# Patient Record
Sex: Female | Born: 1965
Health system: Southern US, Community
[De-identification: ages and names within clinical notes are randomized; demographics above are authoritative.]

## PROBLEM LIST (undated history)

## (undated) DIAGNOSIS — M549 Dorsalgia, unspecified: Secondary | ICD-10-CM

## (undated) DIAGNOSIS — Z720 Tobacco use: Secondary | ICD-10-CM

## (undated) HISTORY — PX: TUBAL LIGATION: SHX77

## (undated) HISTORY — DX: Tobacco use: Z72.0

---

## 2010-06-05 ENCOUNTER — Emergency Department (HOSPITAL_COMMUNITY): Admission: EM | Admit: 2010-06-05 | Discharge: 2010-06-05 | Payer: Self-pay | Admitting: Family Medicine

## 2010-07-26 ENCOUNTER — Emergency Department (HOSPITAL_COMMUNITY)
Admission: EM | Admit: 2010-07-26 | Discharge: 2010-07-26 | Payer: Self-pay | Source: Home / Self Care | Admitting: Emergency Medicine

## 2010-09-26 ENCOUNTER — Inpatient Hospital Stay (INDEPENDENT_AMBULATORY_CARE_PROVIDER_SITE_OTHER)
Admission: RE | Admit: 2010-09-26 | Discharge: 2010-09-26 | Disposition: A | Discharge status: Home / Self Care | Payer: Self-pay | Source: Ambulatory Visit | Attending: Family Medicine | Admitting: Family Medicine

## 2010-09-26 DIAGNOSIS — IMO0002 Reserved for concepts with insufficient information to code with codable children: Secondary | ICD-10-CM

## 2010-09-28 ENCOUNTER — Emergency Department (HOSPITAL_COMMUNITY)
Admission: EM | Admit: 2010-09-28 | Discharge: 2010-09-28 | Disposition: A | Discharge status: Home / Self Care | Payer: BC Managed Care – PPO | Attending: Emergency Medicine | Admitting: Emergency Medicine

## 2010-09-28 ENCOUNTER — Inpatient Hospital Stay (INDEPENDENT_AMBULATORY_CARE_PROVIDER_SITE_OTHER)
Admission: RE | Admit: 2010-09-28 | Discharge: 2010-09-28 | Disposition: A | Discharge status: Home / Self Care | Payer: BC Managed Care – PPO | Source: Ambulatory Visit | Attending: Emergency Medicine | Admitting: Emergency Medicine

## 2010-09-28 DIAGNOSIS — IMO0002 Reserved for concepts with insufficient information to code with codable children: Secondary | ICD-10-CM | POA: Insufficient documentation

## 2010-09-28 DIAGNOSIS — M25529 Pain in unspecified elbow: Secondary | ICD-10-CM | POA: Insufficient documentation

## 2010-09-28 DIAGNOSIS — M25439 Effusion, unspecified wrist: Secondary | ICD-10-CM | POA: Insufficient documentation

## 2010-09-28 LAB — POCT I-STAT, CHEM 8
Calcium, Ion: 1.03 mmol/L — ABNORMAL LOW (ref 1.12–1.32)
HCT: 39 % (ref 36.0–46.0)
Hemoglobin: 13.3 g/dL (ref 12.0–15.0)
Sodium: 139 mEq/L (ref 135–145)
TCO2: 21 mmol/L (ref 0–100)

## 2010-09-28 LAB — CBC
MCH: 28.2 pg (ref 26.0–34.0)
Platelets: 281 10*3/uL (ref 150–400)
RBC: 4.36 MIL/uL (ref 3.87–5.11)
WBC: 9.3 10*3/uL (ref 4.0–10.5)

## 2010-09-28 LAB — DIFFERENTIAL
Basophils Absolute: 0.1 10*3/uL (ref 0.0–0.1)
Basophils Relative: 1 % (ref 0–1)
Eosinophils Absolute: 0.3 10*3/uL (ref 0.0–0.7)
Monocytes Relative: 8 % (ref 3–12)
Neutro Abs: 5.4 10*3/uL (ref 1.7–7.7)
Neutrophils Relative %: 58 % (ref 43–77)

## 2010-09-29 LAB — CULTURE, ROUTINE-ABSCESS

## 2010-10-02 ENCOUNTER — Ambulatory Visit (INDEPENDENT_AMBULATORY_CARE_PROVIDER_SITE_OTHER): Payer: BC Managed Care – PPO | Admitting: Physician Assistant

## 2010-10-02 DIAGNOSIS — L0291 Cutaneous abscess, unspecified: Secondary | ICD-10-CM

## 2010-11-04 LAB — CULTURE, ROUTINE-ABSCESS

## 2013-08-09 ENCOUNTER — Emergency Department (HOSPITAL_COMMUNITY): Payer: Self-pay

## 2013-08-09 ENCOUNTER — Emergency Department (HOSPITAL_COMMUNITY)
Admission: EM | Admit: 2013-08-09 | Discharge: 2013-08-09 | Disposition: A | Discharge status: Home / Self Care | Payer: Self-pay | Attending: Emergency Medicine | Admitting: Emergency Medicine

## 2013-08-09 ENCOUNTER — Encounter (HOSPITAL_COMMUNITY): Payer: Self-pay | Admitting: Emergency Medicine

## 2013-08-09 DIAGNOSIS — R0602 Shortness of breath: Secondary | ICD-10-CM | POA: Insufficient documentation

## 2013-08-09 DIAGNOSIS — R079 Chest pain, unspecified: Secondary | ICD-10-CM

## 2013-08-09 DIAGNOSIS — R072 Precordial pain: Secondary | ICD-10-CM | POA: Insufficient documentation

## 2013-08-09 DIAGNOSIS — Z880 Allergy status to penicillin: Secondary | ICD-10-CM | POA: Insufficient documentation

## 2013-08-09 DIAGNOSIS — F17200 Nicotine dependence, unspecified, uncomplicated: Secondary | ICD-10-CM | POA: Insufficient documentation

## 2013-08-09 DIAGNOSIS — Z3202 Encounter for pregnancy test, result negative: Secondary | ICD-10-CM | POA: Insufficient documentation

## 2013-08-09 DIAGNOSIS — M549 Dorsalgia, unspecified: Secondary | ICD-10-CM | POA: Insufficient documentation

## 2013-08-09 LAB — BASIC METABOLIC PANEL
CO2: 24 mEq/L (ref 19–32)
Calcium: 9 mg/dL (ref 8.4–10.5)
Creatinine, Ser: 0.98 mg/dL (ref 0.50–1.10)
Glucose, Bld: 90 mg/dL (ref 70–99)

## 2013-08-09 LAB — CBC
MCH: 26.9 pg (ref 26.0–34.0)
MCHC: 32.7 g/dL (ref 30.0–36.0)
MCV: 82.4 fL (ref 78.0–100.0)
Platelets: 284 10*3/uL (ref 150–400)
RBC: 4.31 MIL/uL (ref 3.87–5.11)

## 2013-08-09 LAB — POCT I-STAT TROPONIN I: Troponin i, poc: 0 ng/mL (ref 0.00–0.08)

## 2013-08-09 MED ORDER — MORPHINE SULFATE 4 MG/ML IJ SOLN
4.0000 mg | Freq: Once | INTRAMUSCULAR | Status: AC
  Administered 2013-08-09: 4 mg via INTRAVENOUS
  Filled 2013-08-09: qty 1

## 2013-08-09 MED ORDER — GI COCKTAIL ~~LOC~~
30.0000 mL | Freq: Once | ORAL | Status: AC
  Administered 2013-08-09: 30 mL via ORAL
  Filled 2013-08-09: qty 30

## 2013-08-09 MED ORDER — TRAMADOL HCL 50 MG PO TABS: 50.0000 mg | ORAL_TABLET | Freq: Four times a day (QID) | ORAL | Status: DC | PRN

## 2013-08-09 MED ORDER — CYCLOBENZAPRINE HCL 10 MG PO TABS: 10.0000 mg | ORAL_TABLET | Freq: Two times a day (BID) | ORAL | Status: DC | PRN

## 2013-08-09 MED ORDER — ASPIRIN 81 MG PO CHEW
324.0000 mg | CHEWABLE_TABLET | Freq: Once | ORAL | Status: AC
  Administered 2013-08-09: 324 mg via ORAL
  Filled 2013-08-09: qty 4

## 2013-08-09 MED ORDER — ONDANSETRON HCL 4 MG/2ML IJ SOLN
4.0000 mg | Freq: Once | INTRAMUSCULAR | Status: AC
  Administered 2013-08-09: 4 mg via INTRAVENOUS
  Filled 2013-08-09: qty 2

## 2013-08-09 NOTE — ED Provider Notes (Signed)
Medical screening examination/treatment/procedure(s) were conducted as a shared visit with non-physician practitioner(s) and myself.  I personally evaluated the patient during the encounter.   Please see my separate note.      [name], MD 08/09/13 [phone]

## 2013-08-09 NOTE — ED Provider Notes (Signed)
Medical screening examination/treatment/procedure(s) were conducted as a shared visit with non-physician practitioner(s) and myself.  I personally evaluated the patient during the encounter.  EKG Interpretation    Date/Time:  Wednesday August 09 2013 11:27:48 EST Ventricular Rate:  84 PR Interval:  142 QRS Duration: 68 QT Interval:  338 QTC Calculation: 399 R Axis:   77 Text Interpretation:  Normal sinus rhythm with sinus arrhythmia Possible Anterior infarct , age undetermined Abnormal ECG No old tracing to compare Confirmed by [facility]  MD, TREY ([id number]) on 08/09/2013 2:48:12 PM            47 yo female with constant chest pain for several days.  She smokes, but has no other cardiac risk factors.  History atypical for ACS, PE, or dissection.  Possibly GI related.  On exam, well appearing, sitting up in bed, heart sounds normal with RRR, no murmurs, lungs CTAB.  EKG without acute ischemia.  Lab workup unremarkable.  Plan continued outpatient management.   Clinical Impression: 1. Chest pain       [name], MD 08/09/13 1450

## 2013-08-09 NOTE — ED Notes (Signed)
Pt c/o mid sternal CP x 4 days worse with movement and inspiration

## 2013-08-09 NOTE — ED Notes (Signed)
Pt ambulates without distress. Family to drive pt home.

## 2013-08-09 NOTE — ED Provider Notes (Signed)
CSN: [id number]     Arrival date & time 08/09/13  1125 History   First MD Initiated Contact with Patient 08/09/13 1137     Chief Complaint  Patient presents with  . Chest Pain   (Consider location/radiation/quality/duration/timing/severity/associated sxs/prior Treatment) HPI Comments: Patient is a 47 year old female with no significant past medical history who presents with chest pain that started 4 days ago. Patient reports waking up one morning and having severe central back pain. The pain eventually moved to her anterior central chest throughout the day and has remained there. The pain is described as a pressure and is severe. The pain does not radiate. She reports associated SOB. The pain has been constant since the onset. Patient has no significant family history of CAD. Patient is a smoker. She denies recent travel/surgery, exogenous estrogen, or previous DVT/PE.    History reviewed. No pertinent past medical history. History reviewed. No pertinent past surgical history. History reviewed. No pertinent family history. History  Substance Use Topics  . Smoking status: Current Every Day Smoker  . Smokeless tobacco: Not on file  . Alcohol Use: No   OB History   Grav Para Term Preterm Abortions TAB SAB Ect Mult Living                 Review of Systems  Constitutional: Negative for fever, chills and fatigue.  HENT: Negative for trouble swallowing.   Eyes: Negative for visual disturbance.  Respiratory: Positive for shortness of breath.   Cardiovascular: Positive for chest pain. Negative for palpitations.  Gastrointestinal: Negative for nausea, vomiting, abdominal pain and diarrhea.  Genitourinary: Negative for dysuria and difficulty urinating.  Musculoskeletal: Positive for back pain. Negative for arthralgias and neck pain.  Skin: Negative for color change.  Neurological: Negative for dizziness and weakness.  Psychiatric/Behavioral: Negative for dysphoric mood.    Allergies   Penicillins and Sulfa antibiotics  Home Medications   Current Outpatient Rx  Name  Route  Sig  Dispense  Refill  . simethicone (MYLICON) 80 MG chewable tablet   Oral   Chew 80 mg by mouth every 6 (six) hours as needed for flatulence.          BP 137/83  Pulse 72  Temp(Src) 98.6 F (37 C) (Oral)  Resp 18  Ht 5\' 7"  (1.702 m)  Wt 207 lb (93.895 kg)  BMI 32.41 kg/m2  SpO2 100% Physical Exam  Nursing note and vitals reviewed. Constitutional: She is oriented to person, place, and time. She appears well-developed and well-nourished. No distress.  HENT:  Head: Normocephalic and atraumatic.  Eyes: Conjunctivae and EOM are normal.  Neck: Normal range of motion.  Cardiovascular: Normal rate and regular rhythm.  Exam reveals no gallop and no friction rub.   No murmur heard. Pulmonary/Chest: Effort normal and breath sounds normal. She has no wheezes. She has no rales. She exhibits no tenderness.  Abdominal: Soft. She exhibits no distension. There is no tenderness. There is no rebound.  Musculoskeletal: Normal range of motion.  No calf tenderness to palpation bilaterally. No paraspinal or midline spine tenderness to palpation.   Neurological: She is alert and oriented to person, place, and time. Coordination normal.  Speech is goal-oriented. Moves limbs without ataxia.   Skin: Skin is warm and dry.  Psychiatric: She has a normal mood and affect. Her behavior is normal.    ED Course  Procedures (including critical care time) Labs Review Labs Reviewed  CBC - Abnormal; Notable for the following:  Hemoglobin 11.6 (*)    HCT 35.5 (*)    RDW 16.1 (*)    All other components within normal limits  BASIC METABOLIC PANEL - Abnormal; Notable for the following:    GFR calc non Af Amer 69 (*)    GFR calc Af Amer 80 (*)    All other components within normal limits  D-DIMER, QUANTITATIVE  POCT I-STAT TROPONIN I   Imaging Review Dg Chest 2 View  08/09/2013   CLINICAL DATA:  Chest  pain  EXAM: CHEST  2 VIEW  COMPARISON:  None.  FINDINGS: Normal cardiac silhouette and mediastinal contours. Evaluation of the retrosternal spare place is obscured secondary to overlying soft tissues. No focal airspace opacities. No pleural effusion or pneumothorax. No evidence of edema. No acute osseus abnormalities. Hair artifact overlies the right supraclavicular fossa.  IMPRESSION: No acute cardiopulmonary disease.   Electronically Signed   By: Simonne Come M.D.   On: 08/09/2013 12:53    EKG Interpretation    Date/Time:  Wednesday August 09 2013 11:27:48 EST Ventricular Rate:  84 PR Interval:  142 QRS Duration: 68 QT Interval:  338 QTC Calculation: 399 R Axis:   77 Text Interpretation:  Normal sinus rhythm with sinus arrhythmia Possible Anterior infarct , age undetermined Abnormal ECG No old tracing to compare Confirmed by [facility]  MD, TREY ([id number]) on 08/09/2013 2:48:12 PM            MDM   1. Chest pain     12:02 PM Labs, troponin, chest xray pending. Vitals stable and patient afebrile. Patient will have aspirin and morphine.   1:40 PM Labs and chest xray unremarkable for acute changes. Patient will have a d-dimer. Patient reports relief with morphine.   2:50 PM D-dimer negative. Patient is feeling better. Patient is low risk with a HEART score of 1. Vitals stable and patient afebrile. I doubt cardiac etiology at this time. Patient is requesting a GI cocktail before discharge. Patient will be discharge with instructions to follow up with PCP from the resource guide. Patient instructed to return with worsening or concerning symptoms. Patient likely experiencing GERD or muscle pain at this time.     Emilia Beck, PA-C 08/09/13 1452

## 2013-09-14 ENCOUNTER — Ambulatory Visit: Payer: BC Managed Care – PPO | Admitting: Internal Medicine

## 2013-10-17 ENCOUNTER — Ambulatory Visit: Payer: BC Managed Care – PPO

## 2013-10-25 ENCOUNTER — Ambulatory Visit: Payer: BC Managed Care – PPO

## 2014-03-07 ENCOUNTER — Encounter (HOSPITAL_COMMUNITY): Payer: Self-pay | Admitting: Emergency Medicine

## 2014-03-07 ENCOUNTER — Emergency Department (HOSPITAL_COMMUNITY)
Admission: EM | Admit: 2014-03-07 | Discharge: 2014-03-07 | Disposition: A | Discharge status: Home / Self Care | Payer: 59 | Attending: Emergency Medicine | Admitting: Emergency Medicine

## 2014-03-07 ENCOUNTER — Emergency Department (HOSPITAL_COMMUNITY): Payer: 59

## 2014-03-07 DIAGNOSIS — M545 Low back pain, unspecified: Secondary | ICD-10-CM | POA: Insufficient documentation

## 2014-03-07 DIAGNOSIS — Z880 Allergy status to penicillin: Secondary | ICD-10-CM | POA: Insufficient documentation

## 2014-03-07 DIAGNOSIS — F17200 Nicotine dependence, unspecified, uncomplicated: Secondary | ICD-10-CM | POA: Insufficient documentation

## 2014-03-07 DIAGNOSIS — Z3202 Encounter for pregnancy test, result negative: Secondary | ICD-10-CM | POA: Insufficient documentation

## 2014-03-07 LAB — POC URINE PREG, ED: Preg Test, Ur: NEGATIVE

## 2014-03-07 LAB — URINALYSIS, ROUTINE W REFLEX MICROSCOPIC
BILIRUBIN URINE: NEGATIVE
Glucose, UA: NEGATIVE mg/dL
HGB URINE DIPSTICK: NEGATIVE
KETONES UR: NEGATIVE mg/dL
NITRITE: NEGATIVE
PROTEIN: NEGATIVE mg/dL
SPECIFIC GRAVITY, URINE: 1.021 (ref 1.005–1.030)
UROBILINOGEN UA: 0.2 mg/dL (ref 0.0–1.0)
pH: 5.5 (ref 5.0–8.0)

## 2014-03-07 LAB — URINE MICROSCOPIC-ADD ON

## 2014-03-07 MED ORDER — OXYCODONE-ACETAMINOPHEN 5-325 MG PO TABS
1.0000 | ORAL_TABLET | Freq: Once | ORAL | Status: AC
  Administered 2014-03-07: 1 via ORAL
  Filled 2014-03-07: qty 1

## 2014-03-07 MED ORDER — TRAMADOL HCL 50 MG PO TABS: 50.0000 mg | ORAL_TABLET | Freq: Four times a day (QID) | ORAL | Status: DC | PRN

## 2014-03-07 MED ORDER — TRAMADOL HCL 50 MG PO TABS
50.0000 mg | ORAL_TABLET | Freq: Once | ORAL | Status: AC
  Administered 2014-03-07: 50 mg via ORAL
  Filled 2014-03-07: qty 1

## 2014-03-07 MED ORDER — IBUPROFEN 600 MG PO TABS: 600.0000 mg | ORAL_TABLET | Freq: Three times a day (TID) | ORAL | Status: DC | PRN

## 2014-03-07 MED ORDER — IBUPROFEN 200 MG PO TABS
600.0000 mg | ORAL_TABLET | Freq: Once | ORAL | Status: AC
  Administered 2014-03-07: 600 mg via ORAL
  Filled 2014-03-07: qty 3

## 2014-03-07 NOTE — ED Provider Notes (Addendum)
CSN: [id number]     Arrival date & time 03/07/14  [id number] History   First MD Initiated Contact with Patient 03/07/14 [id number]     Chief Complaint  Patient presents with  . Back Pain     (Consider location/radiation/quality/duration/timing/severity/associated sxs/prior Treatment) Patient is a 48 y.o. female presenting with back pain. The history is provided by the patient.  Back Pain Associated symptoms: no abdominal pain, no chest pain, no dysuria, no fever, no headaches, no numbness and no weakness   pt c/o low back pain, esp right, radiating down right leg.  Onset yesterday. Constant. Moderate. No perineal or leg numbness/weakness. No urinary retention or incontinence. No hx ddd or prior back pain. Denies specific injury or strain, no fall. Worse w certain movements, bending at waist, turning torso.  No anterior/abd pain. No dysuria or hematuria. No vaginal discharge or bleeding.     History reviewed. No pertinent past medical history. Past Surgical History  Procedure Laterality Date  . Tubal ligation     No family history on file. History  Substance Use Topics  . Smoking status: Current Every Day Smoker  . Smokeless tobacco: Not on file  . Alcohol Use: No   OB History   Grav Para Term Preterm Abortions TAB SAB Ect Mult Living                 Review of Systems  Constitutional: Negative for fever and chills.  HENT: Negative for sore throat.   Eyes: Negative for redness.  Respiratory: Negative for shortness of breath.   Cardiovascular: Negative for chest pain.  Gastrointestinal: Negative for vomiting and abdominal pain.  Genitourinary: Negative for dysuria, hematuria, flank pain, vaginal bleeding and vaginal discharge.  Musculoskeletal: Positive for back pain. Negative for neck pain.  Skin: Negative for rash.  Neurological: Negative for weakness, numbness and headaches.  Hematological: Does not bruise/bleed easily.  Psychiatric/Behavioral: Negative for confusion.       Allergies  Penicillins and Sulfa antibiotics  Home Medications   Prior to Admission medications   Medication Sig Start Date End Date Taking? Authorizing Provider  cyclobenzaprine (FLEXERIL) 10 MG tablet Take 1 tablet (10 mg total) by mouth 2 (two) times daily as needed for muscle spasms. 08/09/13  Yes Courtney ParisEden W Jones, MD   BP 127/101  Pulse 95  Temp(Src) 97.8 F (36.6 C) (Oral)  Resp 18  Ht 5' 7.5" (1.715 m)  Wt 207 lb (93.895 kg)  BMI 31.92 kg/m2  SpO2 98%  LMP 01/26/2014 Physical Exam  Nursing note and vitals reviewed. Constitutional: She is oriented to person, place, and time. She appears well-developed and well-nourished. No distress.  Eyes: Conjunctivae are normal. No scleral icterus.  Neck: Neck supple. No tracheal deviation present.  Cardiovascular: Normal rate.   Pulmonary/Chest: Effort normal. No respiratory distress.  Abdominal: Soft. Normal appearance and bowel sounds are normal. She exhibits no distension and no mass. There is no tenderness. There is no rebound and no guarding.  Genitourinary:  No cva tenderness  Musculoskeletal: She exhibits no edema.  Mid to lower lumbar tenderness, otherwise CTLS spine, non tender, aligned, no step off.   Neurological: She is alert and oriented to person, place, and time.  Motor intact bil LE, stre 5/5, sens intact. Symmetric reflexes. Steady gait.   Skin: Skin is warm and dry. No rash noted.  Psychiatric: She has a normal mood and affect.    ED Course  Procedures (including critical care time) Labs Review  Results for orders placed  during the hospital encounter of 03/07/14  URINALYSIS, ROUTINE W REFLEX MICROSCOPIC      Result Value Ref Range   Color, Urine YELLOW  YELLOW   APPearance CLOUDY (*) CLEAR   Specific Gravity, Urine 1.021  1.005 - 1.030   pH 5.5  5.0 - 8.0   Glucose, UA NEGATIVE  NEGATIVE mg/dL   Hgb urine dipstick NEGATIVE  NEGATIVE   Bilirubin Urine NEGATIVE  NEGATIVE   Ketones, ur NEGATIVE   NEGATIVE mg/dL   Protein, ur NEGATIVE  NEGATIVE mg/dL   Urobilinogen, UA 0.2  0.0 - 1.0 mg/dL   Nitrite NEGATIVE  NEGATIVE   Leukocytes, UA SMALL (*) NEGATIVE  URINE MICROSCOPIC-ADD ON      Result Value Ref Range   Squamous Epithelial / LPF MANY (*) RARE   WBC, UA 3-6  <3 WBC/hpf   Bacteria, UA FEW (*) RARE   Urine-Other RARE TRICHOMONAS    POC URINE PREG, ED      Result Value Ref Range   Preg Test, Ur NEGATIVE  NEGATIVE   Dg Lumbar Spine Complete  03/07/2014   CLINICAL DATA:  Low back pain  EXAM: LUMBAR SPINE - COMPLETE 4+ VIEW  COMPARISON:  None.  FINDINGS: The vertebral bodies are preserved in height. The disc space heights and facet joints are normal. There is no spondylolisthesis. The pedicles and transverse processes are normal. The observed portions of the sacrum are unremarkable.  IMPRESSION: There is no acute abnormality of the lumbar spine.   Electronically Signed   By: David  [place]   On: 03/07/2014 07:47     MDM  Motrin po. Xr.  Reviewed nursing notes and prior charts for additional history.   xrays neg acute. u preg neg.  Pt drove self to ed.  Will give pain rx for home.  Pt stable for d/c.  At d/c requests stronger pain med in ed, states she can call family to come pick her up.  Ultram po.     Suzi Roots, MD 03/07/14 [phone]

## 2014-03-07 NOTE — ED Notes (Signed)
Pt from home and reports that about 6:30pm yesterday pt began having sharp, constant right lower back 10/10 pain, worse with movement. Denies abnormal urinary symptoms and denies recent injury or trauma to area.

## 2014-03-07 NOTE — ED Notes (Signed)
Patient transported to X-ray 

## 2014-03-07 NOTE — ED Notes (Addendum)
Doctor notified pain currently 10/10 achy sharp back pain. Patient stated drove to ED and will have family member pick patient up from hospital.

## 2014-03-07 NOTE — ED Notes (Signed)
Patient returned from xray.

## 2014-03-07 NOTE — ED Notes (Signed)
Patient called and confirmed a ride.

## 2014-03-07 NOTE — Discharge Instructions (Signed)
Take motrin as need for pain. You may also take ultram as need for pain - no driving when taking.  Follow up with primary care doctor in 1-2 weeks. Return to ER if worse, new symptoms, fevers, intractable pain, leg numbness/weakness, other concern.   You were given pain medication in the ER - no driving for the next 4 hours.     Back Pain, Adult Low back pain is very common. About 1 in 5 people have back pain.The cause of low back pain is rarely dangerous. The pain often gets better over time.About half of people with a sudden onset of back pain feel better in just 2 weeks. About 8 in 10 people feel better by 6 weeks.  CAUSES Some common causes of back pain include:  Strain of the muscles or ligaments supporting the spine.  Wear and tear (degeneration) of the spinal discs.  Arthritis.  Direct injury to the back. DIAGNOSIS Most of the time, the direct cause of low back pain is not known.However, back pain can be treated effectively even when the exact cause of the pain is unknown.Answering your caregiver's questions about your overall health and symptoms is one of the most accurate ways to make sure the cause of your pain is not dangerous. If your caregiver needs more information, he or she may order lab work or imaging tests (X-rays or MRIs).However, even if imaging tests show changes in your back, this usually does not require surgery. HOME CARE INSTRUCTIONS For many people, back pain returns.Since low back pain is rarely dangerous, it is often a condition that people can learn to [facility] their own.   Remain active. It is stressful on the back to sit or stand in one place. Do not sit, drive, or stand in one place for more than 30 minutes at a time. Take short walks on level surfaces as soon as pain allows.Try to increase the length of time you walk each day.  Do not stay in bed.Resting more than 1 or 2 days can delay your recovery.  Do not avoid exercise or work.Your body  is made to move.It is not dangerous to be active, even though your back may hurt.Your back will likely heal faster if you return to being active before your pain is gone.  Pay attention to your body when you bend and lift. Many people have less discomfortwhen lifting if they bend their knees, keep the load close to their bodies,and avoid twisting. Often, the most comfortable positions are those that put less stress on your recovering back.  Find a comfortable position to sleep. Use a firm mattress and lie on your side with your knees slightly bent. If you lie on your back, put a pillow under your knees.  Only take over-the-counter or prescription medicines as directed by your caregiver. Over-the-counter medicines to reduce pain and inflammation are often the most helpful.Your caregiver may prescribe muscle relaxant drugs.These medicines help dull your pain so you can more quickly return to your normal activities and healthy exercise.  Put ice on the injured area.  Put ice in a plastic bag.  Place a towel between your skin and the bag.  Leave the ice on for 15-20 minutes, 03-04 times a day for the first 2 to 3 days. After that, ice and heat may be alternated to reduce pain and spasms.  Ask your caregiver about trying back exercises and gentle massage. This may be of some benefit.  Avoid feeling anxious or stressed.Stress increases muscle  tension and can worsen back pain.It is important to recognize when you are anxious or stressed and learn ways to manage it.Exercise is a great option. SEEK MEDICAL CARE IF:  You have pain that is not relieved with rest or medicine.  You have pain that does not improve in 1 week.  You have new symptoms.  You are generally not feeling well. SEEK IMMEDIATE MEDICAL CARE IF:   You have pain that radiates from your back into your legs.  You develop new bowel or bladder control problems.  You have unusual weakness or numbness in your arms or  legs.  You develop nausea or vomiting.  You develop abdominal pain.  You feel faint. Document Released: 08/10/2005 Document Revised: 02/09/2012 Document Reviewed: 12/29/2010 [facility] Patient Information 2015 [place], [place]. This information is not intended to replace advice given to you by your health care provider. Make sure you discuss any questions you have with your health care provider.    Lumbosacral Strain Lumbosacral strain is a strain of any of the parts that make up your lumbosacral vertebrae. Your lumbosacral vertebrae are the bones that make up the lower third of your backbone. Your lumbosacral vertebrae are held together by muscles and tough, fibrous tissue (ligaments).  CAUSES  A sudden blow to your back can cause lumbosacral strain. Also, anything that causes an excessive stretch of the muscles in the low back can cause this strain. This is typically seen when people exert themselves strenuously, fall, lift heavy objects, bend, or crouch repeatedly. RISK FACTORS  Physically demanding work.  Participation in pushing or pulling sports or sports that require a sudden twist of the back (tennis, golf, baseball).  Weight lifting.  Excessive lower back curvature.  Forward-tilted pelvis.  Weak back or abdominal muscles or both.  Tight hamstrings. SIGNS AND SYMPTOMS  Lumbosacral strain may cause pain in the area of your injury or pain that moves (radiates) down your leg.  DIAGNOSIS Your health care provider can often diagnose lumbosacral strain through a physical exam. In some cases, you may need tests such as X-ray exams.  TREATMENT  Treatment for your lower back injury depends on many factors that your clinician will have to evaluate. However, most treatment will include the use of anti-inflammatory medicines. HOME CARE INSTRUCTIONS   Avoid hard physical activities (tennis, racquetball, waterskiing) if you are not in proper physical condition for it. This may aggravate  or create problems.  If you have a back problem, avoid sports requiring sudden body movements. Swimming and walking are generally safer activities.  Maintain good posture.  Maintain a healthy weight.  For acute conditions, you may put ice on the injured area.  Put ice in a plastic bag.  Place a towel between your skin and the bag.  Leave the ice on for 20 minutes, 2-3 times a day.  When the low back starts healing, stretching and strengthening exercises may be recommended. SEEK MEDICAL CARE IF:  Your back pain is getting worse.  You experience severe back pain not relieved with medicines. SEEK IMMEDIATE MEDICAL CARE IF:   You have numbness, tingling, weakness, or problems with the use of your arms or legs.  There is a change in bowel or bladder control.  You have increasing pain in any area of the body, including your belly (abdomen).  You notice shortness of breath, dizziness, or feel faint.  You feel sick to your stomach (nauseous), are throwing up (vomiting), or become sweaty.  You notice discoloration of your  toes or legs, or your feet get very cold. MAKE SURE YOU:   Understand these instructions.  Will watch your condition.  Will get help right away if you are not doing well or get worse. Document Released: 05/20/2005 Document Revised: 08/15/2013 Document Reviewed: 03/29/2013 [facility] Patient Information 2015 [place], [place]. This information is not intended to replace advice given to you by your health care provider. Make sure you discuss any questions you have with your health care provider.

## 2014-03-07 NOTE — ED Notes (Signed)
Dr. Steinl at bedside 

## 2014-03-10 ENCOUNTER — Emergency Department (HOSPITAL_COMMUNITY)
Admission: EM | Admit: 2014-03-10 | Discharge: 2014-03-10 | Disposition: A | Discharge status: Home / Self Care | Payer: 59 | Attending: Emergency Medicine | Admitting: Emergency Medicine

## 2014-03-10 ENCOUNTER — Encounter (HOSPITAL_COMMUNITY): Payer: Self-pay | Admitting: Emergency Medicine

## 2014-03-10 DIAGNOSIS — Z79899 Other long term (current) drug therapy: Secondary | ICD-10-CM | POA: Insufficient documentation

## 2014-03-10 DIAGNOSIS — M545 Low back pain, unspecified: Secondary | ICD-10-CM | POA: Insufficient documentation

## 2014-03-10 DIAGNOSIS — Z880 Allergy status to penicillin: Secondary | ICD-10-CM | POA: Insufficient documentation

## 2014-03-10 DIAGNOSIS — F17200 Nicotine dependence, unspecified, uncomplicated: Secondary | ICD-10-CM | POA: Insufficient documentation

## 2014-03-10 DIAGNOSIS — M79609 Pain in unspecified limb: Secondary | ICD-10-CM | POA: Insufficient documentation

## 2014-03-10 HISTORY — DX: Dorsalgia, unspecified: M54.9

## 2014-03-10 MED ORDER — HYDROMORPHONE HCL PF 1 MG/ML IJ SOLN
1.0000 mg | Freq: Once | INTRAMUSCULAR | Status: AC
  Administered 2014-03-10: 1 mg via INTRAMUSCULAR
  Filled 2014-03-10: qty 1

## 2014-03-10 MED ORDER — KETOROLAC TROMETHAMINE 60 MG/2ML IM SOLN
60.0000 mg | Freq: Once | INTRAMUSCULAR | Status: AC
  Administered 2014-03-10: 60 mg via INTRAMUSCULAR
  Filled 2014-03-10: qty 2

## 2014-03-10 MED ORDER — PREDNISONE 10 MG PO TABS: 20.0000 mg | ORAL_TABLET | Freq: Every day | ORAL | Status: DC

## 2014-03-10 MED ORDER — OXYCODONE-ACETAMINOPHEN 7.5-325 MG PO TABS: 1.0000 | ORAL_TABLET | ORAL | Status: DC | PRN

## 2014-03-10 MED ORDER — METHOCARBAMOL 750 MG PO TABS: 750.0000 mg | ORAL_TABLET | Freq: Four times a day (QID) | ORAL | Status: DC

## 2014-03-10 MED ORDER — OXYCODONE-ACETAMINOPHEN 5-325 MG PO TABS
1.0000 | ORAL_TABLET | Freq: Once | ORAL | Status: AC
  Administered 2014-03-10: 1 via ORAL
  Filled 2014-03-10: qty 1

## 2014-03-10 MED ORDER — DIAZEPAM 5 MG PO TABS
10.0000 mg | ORAL_TABLET | Freq: Once | ORAL | Status: AC
  Administered 2014-03-10: 10 mg via ORAL
  Filled 2014-03-10: qty 2

## 2014-03-10 NOTE — ED Notes (Signed)
Pt asked for water to take her Tramadol.  Informed pt that I was going to give her a Percocet and she declines and states Percocet did not help pain last time and states she wants to take medication that she has.

## 2014-03-10 NOTE — Discharge Instructions (Signed)

## 2014-03-10 NOTE — ED Notes (Signed)
C/o lower back pain that radiates down R leg since Wednesday.  States she was seen in ED on Wednesday for same.

## 2014-03-10 NOTE — ED Provider Notes (Signed)
CSN: [id number]     Arrival date & time 03/10/14  0425 History   First MD Initiated Contact with Patient 03/10/14 [phone]     Chief Complaint  Patient presents with  . Back Pain  . Leg Pain     (Consider location/radiation/quality/duration/timing/severity/associated sxs/prior Treatment) HPI Comments: Patient here complaining of acute onset of an right-sided flank pain radiating down to her leg that began 3 days ago. Seen here for similar symptoms had a negative plain films of her lumbar spine as well as negative urine. Given medications which haven't helped. She denies any bowel or bladder dysfunction. Denies any dragging of her foot. No recent history of trauma. No rashes to her flank area. No vaginal bleeding or discharge. Symptoms are worse with sitting and standing and characterized as sharp. Slightly better with remaining flat. No prior history of back pain  Patient is a 48 y.o. female presenting with back pain and leg pain. The history is provided by the patient.  Back Pain Associated symptoms: leg pain   Leg Pain Associated symptoms: back pain     Past Medical History  Diagnosis Date  . Back pain    Past Surgical History  Procedure Laterality Date  . Tubal ligation     No family history on file. History  Substance Use Topics  . Smoking status: Current Every Day Smoker  . Smokeless tobacco: Not on file  . Alcohol Use: No   OB History   Grav Para Term Preterm Abortions TAB SAB Ect Mult Living                 Review of Systems  Musculoskeletal: Positive for back pain.  All other systems reviewed and are negative.     Allergies  Penicillins and Sulfa antibiotics  Home Medications   Prior to Admission medications   Medication Sig Start Date End Date Taking? Authorizing Provider  cyclobenzaprine (FLEXERIL) 10 MG tablet Take 1 tablet (10 mg total) by mouth 2 (two) times daily as needed for muscle spasms. 08/09/13  Yes Courtney ParisEden W Jones, MD  ibuprofen (ADVIL,MOTRIN) 600  MG tablet Take 1 tablet (600 mg total) by mouth every 8 (eight) hours as needed. Take with food. 03/07/14  Yes Suzi RootsKevin E Steinl, MD  traMADol (ULTRAM) 50 MG tablet Take 1 tablet (50 mg total) by mouth every 6 (six) hours as needed. 03/07/14  Yes Suzi RootsKevin E Steinl, MD   BP 114/90  Pulse 72  Temp(Src) 97.7 F (36.5 C) (Oral)  Ht 5\' 7"  (1.702 m)  Wt 205 lb (92.987 kg)  BMI 32.10 kg/m2  SpO2 100%  LMP 02/07/2014 Physical Exam  Nursing note and vitals reviewed. Constitutional: She is oriented to person, place, and time. She appears well-developed and well-nourished.  Non-toxic appearance. No distress.  HENT:  Head: Normocephalic and atraumatic.  Eyes: Conjunctivae, EOM and lids are normal. Pupils are equal, round, and reactive to light.  Neck: Normal range of motion. Neck supple. No tracheal deviation present. No mass present.  Cardiovascular: Normal rate, regular rhythm and normal heart sounds.  Exam reveals no gallop.   No murmur heard. Pulmonary/Chest: Effort normal and breath sounds normal. No stridor. No respiratory distress. She has no decreased breath sounds. She has no wheezes. She has no rhonchi. She has no rales.  Abdominal: Soft. Normal appearance and bowel sounds are normal. She exhibits no distension. There is no tenderness. There is no rebound and no CVA tenderness.  Musculoskeletal: Normal range of motion. She exhibits no edema  and no tenderness.       Right shoulder: She exhibits pain.       Arms: Neurological: She is alert and oriented to person, place, and time. She has normal strength. No cranial nerve deficit or sensory deficit. GCS eye subscore is 4. GCS verbal subscore is 5. GCS motor subscore is 6.  Skin: Skin is warm and dry. No abrasion and no rash noted.  Psychiatric: She has a normal mood and affect. Her speech is normal and behavior is normal.    ED Course  Procedures (including critical care time) Labs Review Labs Reviewed - No data to display  Imaging Review No  results found.   EKG Interpretation None      MDM   Final diagnoses:  None    Pt given im toradol here and pain medication feels better. Neurological exam remains stable. Do not see any red flags for cauda equina. Do not believe that the patient needs to have further imaging at this time. Will place on a course of prednisone as well as give opiates and muscle relaxants    Toy Baker, MD 03/10/14 [phone]

## 2014-03-11 ENCOUNTER — Emergency Department (HOSPITAL_COMMUNITY)
Admission: EM | Admit: 2014-03-11 | Discharge: 2014-03-12 | Disposition: A | Discharge status: Home / Self Care | Payer: 59 | Attending: Emergency Medicine | Admitting: Emergency Medicine

## 2014-03-11 ENCOUNTER — Encounter (HOSPITAL_COMMUNITY): Payer: Self-pay | Admitting: Emergency Medicine

## 2014-03-11 DIAGNOSIS — Z79899 Other long term (current) drug therapy: Secondary | ICD-10-CM | POA: Insufficient documentation

## 2014-03-11 DIAGNOSIS — G8911 Acute pain due to trauma: Secondary | ICD-10-CM | POA: Insufficient documentation

## 2014-03-11 DIAGNOSIS — Z880 Allergy status to penicillin: Secondary | ICD-10-CM | POA: Insufficient documentation

## 2014-03-11 DIAGNOSIS — F17200 Nicotine dependence, unspecified, uncomplicated: Secondary | ICD-10-CM | POA: Insufficient documentation

## 2014-03-11 DIAGNOSIS — M6281 Muscle weakness (generalized): Secondary | ICD-10-CM | POA: Insufficient documentation

## 2014-03-11 DIAGNOSIS — M545 Low back pain, unspecified: Secondary | ICD-10-CM | POA: Insufficient documentation

## 2014-03-11 DIAGNOSIS — R29898 Other symptoms and signs involving the musculoskeletal system: Secondary | ICD-10-CM

## 2014-03-11 DIAGNOSIS — IMO0002 Reserved for concepts with insufficient information to code with codable children: Secondary | ICD-10-CM | POA: Insufficient documentation

## 2014-03-11 NOTE — ED Provider Notes (Signed)
CSN: [id number]     Arrival date & time 03/11/14  2113 History   First MD Initiated Contact with Patient 03/11/14 2302     Chief Complaint  Patient presents with  . Fall     (Consider location/radiation/quality/duration/timing/severity/associated sxs/prior Treatment) HPI 48 year old female returns to the emergency department with complaint of persistent low back pain and weakness to right leg.  This is her third ED visit.  Patient reports she is taking medications as prescribed.  She reports the back pain is much improved.  She reports today, however she has had intermittent episodes where her right leg gives out on her.  She reports numbness to the right leg intermittently.  She denies any bowel or bladder problems, aside from constipation which is improved after taking Metamucil.  No trauma to the area, no fevers, no previous back surgery or back problems. Past Medical History  Diagnosis Date  . Back pain    Past Surgical History  Procedure Laterality Date  . Tubal ligation     No family history on file. History  Substance Use Topics  . Smoking status: Current Every Day Smoker  . Smokeless tobacco: Not on file  . Alcohol Use: No   OB History   Grav Para Term Preterm Abortions TAB SAB Ect Mult Living                 Review of Systems   See History of Present Illness; otherwise all other systems are reviewed and negative  Allergies  Penicillins and Sulfa antibiotics  Home Medications   Prior to Admission medications   Medication Sig Start Date End Date Taking? Authorizing Provider  methocarbamol (ROBAXIN-750) 750 MG tablet Take 1 tablet (750 mg total) by mouth 4 (four) times daily. 03/10/14  Yes Toy Baker, MD  oxyCODONE-acetaminophen (PERCOCET) 7.5-325 MG per tablet Take 1 tablet by mouth every 4 (four) hours as needed for pain. 03/10/14  Yes Toy Baker, MD  predniSONE (DELTASONE) 10 MG tablet Take 2 tablets (20 mg total) by mouth daily. 03/10/14  Yes Toy Baker, MD   BP 127/71  Pulse 58  Temp(Src) 98.8 F (37.1 C) (Oral)  Resp 15  Ht 5' 7.5" (1.715 m)  Wt 207 lb (93.895 kg)  BMI 31.92 kg/m2  SpO2 99%  LMP 02/07/2014 Physical Exam  Nursing note and vitals reviewed. Constitutional: She is oriented to person, place, and time. She appears well-developed and well-nourished. No distress.  HENT:  Head: Normocephalic and atraumatic.  Right Ear: External ear normal.  Left Ear: External ear normal.  Nose: Nose normal.  Mouth/Throat: Oropharynx is clear and moist.  Eyes: Conjunctivae and EOM are normal. Pupils are equal, round, and reactive to light.  Neck: Normal range of motion. Neck supple. No JVD present. No tracheal deviation present. No thyromegaly present.  Cardiovascular: Normal rate, regular rhythm, normal heart sounds and intact distal pulses.  Exam reveals no gallop and no friction rub.   No murmur heard. Pulmonary/Chest: Effort normal and breath sounds normal. No stridor. No respiratory distress. She has no wheezes. She has no rales. She exhibits no tenderness.  Abdominal: Soft. Bowel sounds are normal. She exhibits no distension and no mass. There is no tenderness. There is no rebound and no guarding.  Musculoskeletal: Normal range of motion. She exhibits no edema and no tenderness.  Lymphadenopathy:    She has no cervical adenopathy.  Neurological: She is alert and oriented to person, place, and time. No cranial nerve deficit.  She exhibits normal muscle tone. Coordination normal.  Patient has normal sensation at this time to both legs.  She has normal Babinski.  Patient left patellar reflex is 2+, right is 1+.  Strength is normal at foot and ankle, she does have some weakness lifting right leg up off the bed compared to left.  Straight leg raise is negative bilaterally  Skin: Skin is warm and dry. No rash noted. No erythema. No pallor.  Psychiatric: She has a normal mood and affect. Her behavior is normal. Judgment and thought  content normal.    ED Course  Procedures (including critical care time) Labs Review Labs Reviewed - No data to display  Imaging Review No results found.   EKG Interpretation None      MDM   Final diagnoses:  Right leg weakness  Right-sided low back pain without sciatica    48 year old female with persistent back pain although improved with pain medications and right leg weakness.  Will provide patient with crutches for stability.  She does not meet criteria for emergent MRI (no signs of cauda equina, epidural abscess or other urgent evaluation by MRI), although I think given her symptoms and outpatient MRI may be indicated.  Will provide orthopedic number for followup, patient to find a local primary care Dr. as well.  Olivia Mackielga M Shinita Mac, MD 03/12/14 [phone]

## 2014-03-11 NOTE — ED Notes (Signed)
Patient states this is the third time she is here to be seen for the same problem.  States that she has been having some right lower back pain that travels into the thigh and now recently down the front of her right shin causing it to feel numb.  States her right leg gets weak.  + pulses bilaterally

## 2014-03-11 NOTE — ED Notes (Signed)
Upon assessing patient she states that she fell 2 times while at home and once getting from the wheelchair to the stretcher.  Upon further investigation, tech reports that she did not fall but her right leg got weak and he supported her with is leg and assisted her to the stretcher.  At no time did she fall to the floor.

## 2014-03-11 NOTE — ED Notes (Signed)
Pt. reported that she fell x2 today " my right leg gave out" , stated numbness at left shin , skin intact , alert and oriented , respirations unlabored, seen here yesterday prescribed with Prednisone / Robaxin and Percocet.

## 2014-03-12 MED ORDER — POLYETHYLENE GLYCOL 3350 17 G PO PACK: 17.0000 g | PACK | Freq: Every day | ORAL | Status: DC

## 2014-03-12 NOTE — ED Notes (Signed)
Instructions given on the use of crutches.  Voiced understanding.

## 2014-03-12 NOTE — Discharge Instructions (Signed)
Please follow up with a local primary care doctor and orthopedics for further workup of your symptoms.  Take medications as prescribed.

## 2014-06-18 ENCOUNTER — Inpatient Hospital Stay (HOSPITAL_COMMUNITY)
Admission: AD | Admit: 2014-06-18 | Discharge: 2014-06-18 | Disposition: A | Discharge status: Home / Self Care | Payer: 59 | Source: Ambulatory Visit | Attending: Obstetrics and Gynecology | Admitting: Obstetrics and Gynecology

## 2014-06-18 ENCOUNTER — Encounter (HOSPITAL_COMMUNITY): Payer: Self-pay | Admitting: *Deleted

## 2014-06-18 DIAGNOSIS — B9689 Other specified bacterial agents as the cause of diseases classified elsewhere: Secondary | ICD-10-CM

## 2014-06-18 DIAGNOSIS — N760 Acute vaginitis: Secondary | ICD-10-CM | POA: Insufficient documentation

## 2014-06-18 DIAGNOSIS — N926 Irregular menstruation, unspecified: Secondary | ICD-10-CM

## 2014-06-18 DIAGNOSIS — F17210 Nicotine dependence, cigarettes, uncomplicated: Secondary | ICD-10-CM | POA: Insufficient documentation

## 2014-06-18 DIAGNOSIS — A499 Bacterial infection, unspecified: Secondary | ICD-10-CM

## 2014-06-18 LAB — URINALYSIS, ROUTINE W REFLEX MICROSCOPIC
Bilirubin Urine: NEGATIVE
Glucose, UA: NEGATIVE mg/dL
HGB URINE DIPSTICK: NEGATIVE
Ketones, ur: NEGATIVE mg/dL
Nitrite: NEGATIVE
PROTEIN: NEGATIVE mg/dL
Specific Gravity, Urine: 1.025 (ref 1.005–1.030)
UROBILINOGEN UA: 0.2 mg/dL (ref 0.0–1.0)
pH: 6 (ref 5.0–8.0)

## 2014-06-18 LAB — CBC
HCT: 36.8 % (ref 36.0–46.0)
Hemoglobin: 11.9 g/dL — ABNORMAL LOW (ref 12.0–15.0)
MCH: 27.2 pg (ref 26.0–34.0)
MCHC: 32.3 g/dL (ref 30.0–36.0)
MCV: 84 fL (ref 78.0–100.0)
PLATELETS: 290 10*3/uL (ref 150–400)
RBC: 4.38 MIL/uL (ref 3.87–5.11)
RDW: 15.5 % (ref 11.5–15.5)
WBC: 6.3 10*3/uL (ref 4.0–10.5)

## 2014-06-18 LAB — WET PREP, GENITAL
TRICH WET PREP: NONE SEEN
YEAST WET PREP: NONE SEEN

## 2014-06-18 LAB — URINE MICROSCOPIC-ADD ON

## 2014-06-18 LAB — POCT PREGNANCY, URINE: Preg Test, Ur: NEGATIVE

## 2014-06-18 MED ORDER — METRONIDAZOLE 500 MG PO TABS: 500.0000 mg | ORAL_TABLET | Freq: Two times a day (BID) | ORAL | Status: DC

## 2014-06-18 NOTE — MAU Note (Signed)
Cycle twice this month,  Oct 6 - oct 9 Then again oct 20, currently bleeding, hourly  Tampon and pad

## 2014-06-18 NOTE — Discharge Instructions (Signed)
Abnormal Uterine Bleeding °Abnormal uterine bleeding can affect women at various stages in life, including teenagers, women in their reproductive years, pregnant women, and women who have reached menopause. Several kinds of uterine bleeding are considered abnormal, including: °· Bleeding or spotting between periods.   °· Bleeding after sexual intercourse.   °· Bleeding that is heavier or more than normal.   °· Periods that last longer than usual. °· Bleeding after menopause.   °Many cases of abnormal uterine bleeding are minor and simple to treat, while others are more serious. Any type of abnormal bleeding should be evaluated by your health care provider. Treatment will depend on the cause of the bleeding. °HOME CARE INSTRUCTIONS °Monitor your condition for any changes. The following actions may help to alleviate any discomfort you are experiencing: °· Avoid the use of tampons and douches as directed by your health care provider. °· Change your pads frequently. °You should get regular pelvic exams and Pap tests. Keep all follow-up appointments for diagnostic tests as directed by your health care provider.  °SEEK MEDICAL CARE IF:  °· Your bleeding lasts more than 1 week.   °· You feel dizzy at times.   °SEEK IMMEDIATE MEDICAL CARE IF:  °· You pass out.   °· You are changing pads every 15 to 30 minutes.   °· You have abdominal pain. °· You have a fever.   °· You become sweaty or weak.   °· You are passing large blood clots from the vagina.   °· You start to feel nauseous and vomit. °MAKE SURE YOU:  °· Understand these instructions. °· Will watch your condition. °· Will get help right away if you are not doing well or get worse. °Document Released: 08/10/2005 Document Revised: 08/15/2013 Document Reviewed: 03/09/2013 °ExitCare® Patient Information ©2015 [employer]. This information is not intended to replace advice given to you by your health care provider. Make sure you discuss any questions you have with your  health care provider. ° °Bacterial Vaginosis °Bacterial vaginosis is a vaginal infection that occurs when the normal balance of bacteria in the vagina is disrupted. It results from an overgrowth of certain bacteria. This is the most common vaginal infection in women of childbearing age. Treatment is important to prevent complications, especially in pregnant women, as it can cause a premature delivery. °CAUSES  °Bacterial vaginosis is caused by an increase in harmful bacteria that are normally present in smaller amounts in the vagina. Several different kinds of bacteria can cause bacterial vaginosis. However, the reason that the condition develops is not fully understood. °RISK FACTORS °Certain activities or behaviors can put you at an increased risk of developing bacterial vaginosis, including: °· Having a new sex partner or multiple sex partners. °· Douching. °· Using an intrauterine device (IUD) for contraception. °Women do not get bacterial vaginosis from toilet seats, bedding, swimming pools, or contact with objects around them. °SIGNS AND SYMPTOMS  °Some women with bacterial vaginosis have no signs or symptoms. Common symptoms include: °· Grey vaginal discharge. °· A fishlike odor with discharge, especially after sexual intercourse. °· Itching or burning of the vagina and vulva. °· Burning or pain with urination. °DIAGNOSIS  °Your health care provider will take a medical history and examine the vagina for signs of bacterial vaginosis. A sample of vaginal fluid may be taken. Your health care provider will look at this sample under a microscope to check for bacteria and abnormal cells. A vaginal pH test may also be done.  °TREATMENT  °Bacterial vaginosis may be treated with antibiotic medicines. These   may be given in the form of a pill or a vaginal cream. A second round of antibiotics may be prescribed if the condition comes back after treatment.  °HOME CARE INSTRUCTIONS  °· Only take over-the-counter or  prescription medicines as directed by your health care provider. °· If antibiotic medicine was prescribed, take it as directed. Make sure you finish it even if you start to feel better. °· Do not have sex until treatment is completed. °· Tell all sexual partners that you have a vaginal infection. They should see their health care provider and be treated if they have problems, such as a mild rash or itching. °· Practice safe sex by using condoms and only having one sex partner. °SEEK MEDICAL CARE IF:  °· Your symptoms are not improving after 3 days of treatment. °· You have increased discharge or pain. °· You have a fever. °MAKE SURE YOU:  °· Understand these instructions. °· Will watch your condition. °· Will get help right away if you are not doing well or get worse. °FOR MORE INFORMATION  °Centers for Disease Control and Prevention, Division of STD Prevention: www.cdc.gov/std °American Sexual Health Association (ASHA): www.ashastd.org  °Document Released: 08/10/2005 Document Revised: 05/31/2013 Document Reviewed: 03/22/2013 °ExitCare® Patient Information ©2015 [employer]. This information is not intended to replace advice given to you by your health care provider. Make sure you discuss any questions you have with your health care provider. ° °

## 2014-06-18 NOTE — MAU Note (Signed)
Second week in October had menstrual cycle. Went off x5 days, then returned 06/12/2014 heavy. Using tampon and pad, changes tampon hourly. Large clots noted. No pain at present.

## 2014-06-18 NOTE — MAU Provider Note (Signed)
History     CSN: [id number]  Arrival date and time: 06/18/14 1519   First Provider Initiated Contact with Patient 06/18/14 1907      Chief Complaint  Patient presents with  . Vaginal Bleeding   HPI Nils PyleMonica A Alldredge 48 y.o. [id number] nonpregnant female presents to MAU complains of 2 menstrual bleeds this month.  6 days ago she started bleeding for the second time and it has been excessively heavy.  2 days ago it was very heavy with clots but is now more slow.  She is bleeding red blood with clots.  She previously had cramping but his has improved.  Midol has been used and is helpful.  No Midol today.  Advil PM is also helpful and was taken last night.  She is having night sweats and may be having vaginal dryness.  She has had diarrhea just over the last few days and she has headaches that are typical for her.    Past Medical History  Diagnosis Date  . Back pain     Past Surgical History  Procedure Laterality Date  . Tubal ligation      No family history on file.  History  Substance Use Topics  . Smoking status: Current Every Day Smoker  . Smokeless tobacco: Not on file  . Alcohol Use: No    Allergies:  Allergies  Allergen Reactions  . Penicillins Anaphylaxis and Swelling  . Sulfa Antibiotics Rash    Prescriptions prior to admission  Medication Sig Dispense Refill  . methocarbamol (ROBAXIN-750) 750 MG tablet Take 1 tablet (750 mg total) by mouth 4 (four) times daily.  30 tablet  0  . oxyCODONE-acetaminophen (PERCOCET) 7.5-325 MG per tablet Take 1 tablet by mouth every 4 (four) hours as needed for pain.  10 tablet  0  . polyethylene glycol (MIRALAX / GLYCOLAX) packet Take 17 g by mouth daily.  14 each  0  . predniSONE (DELTASONE) 10 MG tablet Take 2 tablets (20 mg total) by mouth daily.  15 tablet  0    Review of Systems  Constitutional: Positive for chills and diaphoresis. Negative for fever.  HENT: Negative for congestion and sore throat.   Eyes: Negative for  blurred vision and double vision.  Respiratory: Negative for cough, shortness of breath and wheezing.   Cardiovascular: Negative for chest pain and palpitations.  Gastrointestinal: Positive for abdominal pain and diarrhea. Negative for heartburn, nausea, vomiting and constipation.  Genitourinary: Negative for dysuria and frequency.  Musculoskeletal: Negative for back pain and neck pain.  Skin: Negative for itching and rash.  Neurological: Positive for headaches. Negative for dizziness, tingling and weakness.  Psychiatric/Behavioral: Negative for depression, suicidal ideas and substance abuse. The patient is not nervous/anxious.    Physical Exam   Blood pressure 116/71, pulse 82, temperature 98.1 F (36.7 C), temperature source Oral, resp. rate 16, height 5\' 7"  (1.702 m), weight 230 lb (104.327 kg), last menstrual period 06/12/2014, SpO2 100.00%.  Physical Exam  Constitutional: She is oriented to person, place, and time. She appears well-developed and well-nourished.  HENT:  Head: Normocephalic and atraumatic.  Eyes: EOM are normal.  Neck: Normal range of motion.  Cardiovascular: Normal rate, regular rhythm and normal heart sounds.   Respiratory: Effort normal and breath sounds normal. No respiratory distress.  GI: Soft. Bowel sounds are normal. She exhibits no distension.  Genitourinary:  Vagina with moderate amt of tan colored malodorous discharge.  No frank blood witnessed.  Nothing noted to be in cervical  os.   No CMT.  No adnexal mass or tenderness.  Musculoskeletal: Normal range of motion.  Neurological: She is alert and oriented to person, place, and time.  Skin: Skin is warm.  Psychiatric: She has a normal mood and affect.   Results for orders placed during the hospital encounter of 06/18/14 (from the past 24 hour(s))  URINALYSIS, ROUTINE W REFLEX MICROSCOPIC     Status: Abnormal   Collection Time    06/18/14  6:04 PM      Result Value Ref Range   Color, Urine YELLOW   YELLOW   APPearance CLEAR  CLEAR   Specific Gravity, Urine 1.025  1.005 - 1.030   pH 6.0  5.0 - 8.0   Glucose, UA NEGATIVE  NEGATIVE mg/dL   Hgb urine dipstick NEGATIVE  NEGATIVE   Bilirubin Urine NEGATIVE  NEGATIVE   Ketones, ur NEGATIVE  NEGATIVE mg/dL   Protein, ur NEGATIVE  NEGATIVE mg/dL   Urobilinogen, UA 0.2  0.0 - 1.0 mg/dL   Nitrite NEGATIVE  NEGATIVE   Leukocytes, UA TRACE (*) NEGATIVE  URINE MICROSCOPIC-ADD ON     Status: Abnormal   Collection Time    06/18/14  6:04 PM      Result Value Ref Range   Squamous Epithelial / LPF RARE  RARE   WBC, UA 3-6  <3 WBC/hpf   RBC / HPF 3-6  <3 RBC/hpf   Bacteria, UA FEW (*) RARE  CBC     Status: Abnormal   Collection Time    06/18/14  6:08 PM      Result Value Ref Range   WBC 6.3  4.0 - 10.5 K/uL   RBC 4.38  3.87 - 5.11 MIL/uL   Hemoglobin 11.9 (*) 12.0 - 15.0 g/dL   HCT 16.136.8  09.636.0 - 04.546.0 %   MCV 84.0  78.0 - 100.0 fL   MCH 27.2  26.0 - 34.0 pg   MCHC 32.3  30.0 - 36.0 g/dL   RDW 40.915.5  81.111.5 - 91.415.5 %   Platelets 290  150 - 400 K/uL  POCT PREGNANCY, URINE     Status: None   Collection Time    06/18/14  6:21 PM      Result Value Ref Range   Preg Test, Ur NEGATIVE  NEGATIVE  WET PREP, GENITAL     Status: Abnormal   Collection Time    06/18/14  7:54 PM      Result Value Ref Range   Yeast Wet Prep HPF POC NONE SEEN  NONE SEEN   Trich, Wet Prep NONE SEEN  NONE SEEN   Clue Cells Wet Prep HPF POC MODERATE (*) NONE SEEN   WBC, Wet Prep HPF POC FEW (*) NONE SEEN    MAU Course  Procedures  MDM Irregular menses - ? anovulatory bleeding Clinical eval and lab results consistent with Bacterial Vaginosis  Assessment and Plan  A:  1. Bacterial vaginosis   2. Irregular menstrual cycle    P: Discharge to home Follow up with GYN on 11/10 as scheduled Flagyl x 1 week.   No etoh/IC x 10 days Return to MAU for emergency  Bertram Denvereague Clark, Karen E 06/18/2014, 7:09 PM

## 2014-06-18 NOTE — MAU Provider Note (Signed)
Attestation of Attending Supervision of Advanced Practitioner (CNM/NP): Evaluation and management procedures were performed by the Advanced Practitioner under my supervision and collaboration.  I have reviewed the Advanced Practitioner's note and chart, and I agree with the management and plan.  Emmerson Shuffield 06/18/2014 8:50 PM

## 2014-06-19 ENCOUNTER — Telehealth: Payer: Self-pay | Admitting: Infectious Diseases

## 2014-06-19 LAB — HIV ANTIBODY (ROUTINE TESTING W REFLEX): HIV 1&2 Ab, 4th Generation: REACTIVE — AB

## 2014-06-19 LAB — GC/CHLAMYDIA PROBE AMP
CT Probe RNA: NEGATIVE
GC PROBE AMP APTIMA: NEGATIVE

## 2014-06-19 LAB — HIV 1/2 CONFIRMATION
HIV 2 AB: NEGATIVE
HIV-1 antibody: NEGATIVE

## 2014-06-19 NOTE — Telephone Encounter (Signed)
Called pt, let her know that she had a initial positive HIV test after she pressed me for more information.  will have her seen in ID clinic on 10-30.

## 2014-06-20 ENCOUNTER — Telehealth: Payer: Self-pay | Admitting: *Deleted

## 2014-06-20 ENCOUNTER — Telehealth: Payer: Self-pay | Admitting: Infectious Diseases

## 2014-06-20 NOTE — Telephone Encounter (Signed)
No answer, generic voicemail box.  Left message: Pt has appointment with Dr. Ninetta LightsHatcher Friday 10/30, 9:45 am. Please call us back to confirm. Andree CossHowell, Tamon Parkerson M, RN

## 2014-06-20 NOTE — Telephone Encounter (Signed)
Patient's phone unable to accept incoming calls, unable to leave message.

## 2014-06-20 NOTE — Telephone Encounter (Signed)
Thank you :)

## 2014-06-20 NOTE — Telephone Encounter (Signed)
No return call received after leaving voicemail this morning.  Tried to contact patient on alternate numbers. Phone number listed as "work" was invalid, was registered to [employer] [employer].  Removed at facility's request. No answer at alternate work phone number. Called emergency contact (daughter), no answer, voicemail is disabled.  Called patient on her cell phone for 3rd time, she answered.  Confirmed appointment, although patient was whispering and it was difficult for RN to hear.  RN asked pt to speak a little louder, she stated she did not want to talk at the present time because she was at work.  RN asked if she knew where we were located, if she had any questions.  Pt whispered she was fine. Confirmed she had our phone number if she had questions.  Andree CossHowell, Michelle M, RN

## 2014-06-20 NOTE — Telephone Encounter (Signed)
error 

## 2014-06-21 LAB — HIV-1 RNA, QUALITATIVE, TMA: HIV-1 RNA, Qualitative, TMA: NOT DETECTED

## 2014-06-22 ENCOUNTER — Ambulatory Visit (INDEPENDENT_AMBULATORY_CARE_PROVIDER_SITE_OTHER): Payer: 59 | Admitting: Infectious Diseases

## 2014-06-22 ENCOUNTER — Encounter: Payer: Self-pay | Admitting: Infectious Diseases

## 2014-06-22 VITALS — BP 125/86 | HR 75 | Temp 99.7°F | Ht 67.5 in | Wt 230.0 lb

## 2014-06-22 DIAGNOSIS — Z720 Tobacco use: Secondary | ICD-10-CM | POA: Insufficient documentation

## 2014-06-22 DIAGNOSIS — Z789 Other specified health status: Secondary | ICD-10-CM

## 2014-06-22 NOTE — Progress Notes (Signed)
   Subjective:    Patient ID: Tracy Noble, female    DOB: Apr 08, 1966, 48 y.o.   MRN: [id number]  HPI 48 yo F with new HIV+ from ED test. Was seen in 3 months ago for sciatic nerve pain and got cortisone shot, otherwise no medical issues.  No recent illnesses.  Wt steady. Has been trying to watch what she eats.    Review of Systems  Constitutional: Negative for fever, chills, appetite change and unexpected weight change.  HENT: Negative for sore throat.   Gastrointestinal: Negative for diarrhea and constipation.  Genitourinary: Negative for difficulty urinating.  Hematological: Negative for adenopathy.       Objective:   Physical Exam  Constitutional: She appears well-developed and well-nourished.  HENT:  Mouth/Throat: No oropharyngeal exudate.  Eyes: EOM are normal. Pupils are equal, round, and reactive to light.  Neck: Neck supple.  Cardiovascular: Normal rate, regular rhythm and normal heart sounds.   Pulmonary/Chest: Effort normal and breath sounds normal.  Abdominal: Soft. Bowel sounds are normal. She exhibits no distension. There is no tenderness.  Musculoskeletal: She exhibits no edema.  Lymphadenopathy:    She has no cervical adenopathy.          Assessment & Plan:

## 2014-06-22 NOTE — Assessment & Plan Note (Signed)
We reviewed her serologies- she has 1/2 positive tests. Her viral load is negative. I suspect has a false + Ab test. She is advised to have repeat testing in 6-12 weeks.  She is not sexually active.  I let her know that I am happy to see her at any time to answer any further questions she may have.

## 2016-12-19 ENCOUNTER — Encounter (HOSPITAL_COMMUNITY): Payer: Self-pay | Admitting: *Deleted

## 2016-12-19 ENCOUNTER — Ambulatory Visit (HOSPITAL_COMMUNITY)
Admission: EM | Admit: 2016-12-19 | Discharge: 2016-12-19 | Disposition: A | Discharge status: Home / Self Care | Payer: 59 | Attending: Nurse Practitioner | Admitting: Nurse Practitioner

## 2016-12-19 DIAGNOSIS — J0100 Acute maxillary sinusitis, unspecified: Secondary | ICD-10-CM | POA: Diagnosis not present

## 2016-12-19 MED ORDER — DOXYCYCLINE HYCLATE 100 MG PO CAPS: 100.0000 mg | ORAL_CAPSULE | Freq: Two times a day (BID) | ORAL | 0 refills | Status: AC

## 2016-12-19 NOTE — ED Triage Notes (Signed)
C/O right maxillary sinus pressure x 9 days without fever.

## 2016-12-19 NOTE — Discharge Instructions (Signed)
I am going to treat you empirically today for sinus infection. Please take the antibiotic twice a day for 7 days. If pain does not improve, then please follow up with her primary care doctor.

## 2016-12-19 NOTE — ED Provider Notes (Signed)
CSN: [id number]     Arrival date & time 12/19/16  1202 History   First MD Initiated Contact with Patient 12/19/16 1250     Chief Complaint  Patient presents with  . Facial Pain   (Consider location/radiation/quality/duration/timing/severity/associated sxs/prior Treatment) Patient is a healthy 51 year old female, presents today for right maxillary sinus pain since last Thursday. Onset was sudden. Symptoms have not gotten worse or better. She otherwise denies congestion, sneezing, coughing, runny nose. She also denies fever, shortness of breath or chest pain. Patient has been taking over-the-counter Tylenol and Claritin with no relief. She does reports to have seasonal allergies.      Past Medical History:  Diagnosis Date  . Back pain   . Tobacco use    Past Surgical History:  Procedure Laterality Date  . TUBAL LIGATION     Family History  Problem Relation Age of Onset  . HIV Father     IVDA   Social History  Substance Use Topics  . Smoking status: Current Every Day Smoker    Packs/day: 0.50    Types: Cigarettes    Start date: 08/24/1984  . Smokeless tobacco: Never Used  . Alcohol use No   OB History    No data available     Review of Systems  Constitutional: Negative for chills, fatigue and fever.  HENT: Positive for sinus pain. Negative for congestion, facial swelling, sneezing, sore throat and tinnitus.   Respiratory: Negative for shortness of breath.   Cardiovascular: Negative for chest pain.  Gastrointestinal: Negative for abdominal pain.  Neurological: Negative for dizziness and headaches.    Allergies  Penicillins and Sulfa antibiotics  Home Medications   Prior to Admission medications   Medication Sig Start Date End Date Taking? Authorizing Provider  doxycycline (VIBRAMYCIN) 100 MG capsule Take 1 capsule (100 mg total) by mouth 2 (two) times daily. 12/19/16 12/26/16  Lucia Estelle, NP  Ibuprofen-Diphenhydramine Cit (ADVIL PM PO) Take 1 tablet by mouth.     Historical Provider, MD  metroNIDAZOLE (FLAGYL) 500 MG tablet Take 1 tablet (500 mg total) by mouth 2 (two) times daily. 06/18/14   Bertram Denver, PA-C   Meds Ordered and Administered this Visit  Medications - No data to display  BP 128/76   Pulse 72   Temp 98.3 F (36.8 C) (Oral)   Resp 18   LMP 12/19/2016 (Exact Date)   SpO2 100%  No data found.   Physical Exam  Constitutional: She is oriented to person, place, and time. She appears well-developed and well-nourished.  HENT:  Head: Normocephalic and atraumatic.  Right Ear: External ear normal.  Left Ear: External ear normal.  Nose: Nose normal.  Mouth/Throat: Oropharynx is clear and moist. No oropharyngeal exudate.  TM pearly gray bilaterally with no erythema. Right maxillary sinus is tender on percussion. She has no facial swelling noted.  Eyes: Conjunctivae and EOM are normal. Pupils are equal, round, and reactive to light.  Neck: Normal range of motion. Neck supple.  Cardiovascular: Normal rate, regular rhythm and normal heart sounds.   Pulmonary/Chest: Effort normal and breath sounds normal. No respiratory distress.  Abdominal: Soft. Bowel sounds are normal. She exhibits no distension. There is no tenderness.  Musculoskeletal: Normal range of motion.  Neurological: She is alert and oriented to person, place, and time.  Skin: Skin is warm and dry.  Psychiatric: She has a normal mood and affect.  Nursing note and vitals reviewed.   Urgent Care Course  Procedures (including critical care time)  Labs Review Labs Reviewed - No data to display  Imaging Review No results found.  MDM   1. Acute non-recurrent maxillary sinusitis    We'll treat empirically today for sinus infection. Prescription for doxycycline sent in to take twice a day 7 days. Patient informed that if her symptom does not improve, she would need to follow up with the primary care doctor.    Lucia Estelle, NP 12/19/16 [phone]

## 2017-06-15 ENCOUNTER — Ambulatory Visit (HOSPITAL_COMMUNITY)
Admission: EM | Admit: 2017-06-15 | Discharge: 2017-06-15 | Disposition: A | Discharge status: Home / Self Care | Payer: 59 | Attending: Emergency Medicine | Admitting: Emergency Medicine

## 2017-06-15 ENCOUNTER — Encounter (HOSPITAL_COMMUNITY): Payer: Self-pay | Admitting: Emergency Medicine

## 2017-06-15 DIAGNOSIS — J069 Acute upper respiratory infection, unspecified: Secondary | ICD-10-CM | POA: Diagnosis not present

## 2017-06-15 DIAGNOSIS — R05 Cough: Secondary | ICD-10-CM | POA: Diagnosis not present

## 2017-06-15 DIAGNOSIS — R059 Cough, unspecified: Secondary | ICD-10-CM

## 2017-06-15 MED ORDER — IPRATROPIUM BROMIDE 0.06 % NA SOLN: 2.0000 | Freq: Four times a day (QID) | NASAL | 0 refills | Status: DC

## 2017-06-15 MED ORDER — AEROCHAMBER PLUS MISC: 2 refills | Status: DC

## 2017-06-15 MED ORDER — HYDROCOD POLST-CPM POLST ER 10-8 MG/5ML PO SUER: 5.0000 mL | Freq: Two times a day (BID) | ORAL | 0 refills | Status: DC | PRN

## 2017-06-15 MED ORDER — ALBUTEROL SULFATE HFA 108 (90 BASE) MCG/ACT IN AERS: 1.0000 | INHALATION_SPRAY | Freq: Four times a day (QID) | RESPIRATORY_TRACT | 0 refills | Status: DC | PRN

## 2017-06-15 MED ORDER — IBUPROFEN 600 MG PO TABS: 600.0000 mg | ORAL_TABLET | Freq: Four times a day (QID) | ORAL | 0 refills | Status: DC | PRN

## 2017-06-15 NOTE — Discharge Instructions (Signed)
Take the medication as written. Take 1 gram of tylenol with the 600 mg motrin up to 3-4 times a day as needed for pain and fever. This is an effective combination. Drink extra fluids. Start taking the mucinex to keep the mucus secretions thin. If this does not help then switched to Claritin/Zyrtec/Allegra-D. 1-2 puffs from your albuterol inhaler using a spacer every 4-6 hours as needed for coughing and wheezing. Tussionex at night . Use a neti pot or the NeilMed sinus rinse as often as you want to to reduce nasal congestion. Follow the directions on the box.  Go to www.goodrx.com to look up your medications. This will give you a list of where you can find your prescriptions at the most affordable prices. Or ask the pharmacist what the cash price is, or if they have any other discount programs available to help make your medication more affordable. This can be less expensive than what you would pay with insurance.

## 2017-06-15 NOTE — ED Triage Notes (Signed)
Pt here for cough and URI sx x 4 days 

## 2017-06-15 NOTE — ED Provider Notes (Signed)
HPI  SUBJECTIVE:  Tracy Noble is a 51 y.o. female who presents with 4 days of  chest congestion, wheezing especially at night, nonproductive cough. States that she is unable to sleep at night secondary to the cough. States that her back is sore secondary to the cough. She reports right-sided sinus pain and pressure. She denies nasal congestion, rhinorrhea, postnasal drip. No fevers, allergy type symptoms. She does state that her GERD is bothering her. She denies chest pain, shortness of breath with heavy exertion. No sick contacts, no antibiotics in the past month. She has taken Tylenol within 6-8 hours of evaluation. Past medical history of GERD. No history of hypertension, asthma, emphysema, COPD, diabetes. She is a smoker. LMP: 9/29. Denies possibility pregnant, status post bilateral tubal ligation. PMD: Dr. Huntley Dec.   Past Medical History:  Diagnosis Date  . Back pain   . Tobacco use     Past Surgical History:  Procedure Laterality Date  . TUBAL LIGATION      Family History  Problem Relation Age of Onset  . HIV Father        IVDA    Social History  Substance Use Topics  . Smoking status: Current Every Day Smoker    Packs/day: 0.50    Types: Cigarettes    Start date: 08/24/1984  . Smokeless tobacco: Never Used  . Alcohol use No    No current facility-administered medications for this encounter.   Current Outpatient Prescriptions:  .  albuterol (PROVENTIL HFA;VENTOLIN HFA) 108 (90 Base) MCG/ACT inhaler, Inhale 1-2 puffs into the lungs every 6 (six) hours as needed for wheezing or shortness of breath., Disp: 1 Inhaler, Rfl: 0 .  chlorpheniramine-HYDROcodone (TUSSIONEX PENNKINETIC ER) 10-8 MG/5ML SUER, Take 5 mLs by mouth every 12 (twelve) hours as needed for cough., Disp: 120 mL, Rfl: 0 .  ibuprofen (ADVIL,MOTRIN) 600 MG tablet, Take 1 tablet (600 mg total) by mouth every 6 (six) hours as needed., Disp: 30 tablet, Rfl: 0 .  ipratropium (ATROVENT) 0.06 % nasal spray, Place 2  sprays into both nostrils 4 (four) times daily. 3-4 times/ day, Disp: 15 mL, Rfl: 0 .  Spacer/Aero-Holding Chambers (AEROCHAMBER PLUS) inhaler, Use as instructed, Disp: 1 each, Rfl: 2  Allergies  Allergen Reactions  . Penicillins Anaphylaxis and Swelling  . Sulfa Antibiotics Swelling and Rash     ROS  As noted in HPI.   Physical Exam  BP (!) 142/78 (BP Location: Left Arm)   Pulse 69   Temp 98.2 F (36.8 C) (Oral)   Resp 18   SpO2 100%   Constitutional: Well developed, well nourished, no acute distress Eyes:  EOMI, conjunctiva normal bilaterally HENT: Normocephalic, atraumatic,mucus membranes moist. Positive swollen turbinates left side, positive mucopurulent nasal congestion. No maxillary or frontal sinus tenderness, positive cobblestoning and postnasal drip. Respiratory: Normal inspiratory effort good air movement, lungs clear bilaterally. positive chest wall tenderness Cardiovascular: Normal rate regular rhythm no murmurs rubs or gallops GI: nondistended skin: No rash, skin intact Musculoskeletal: no deformities Neurologic: Alert & oriented x 3, no focal neuro deficits Psychiatric: Speech and behavior appropriate   ED Course   Medications - No data to display  No orders of the defined types were placed in this encounter.   No results found for this or any previous visit (from the past 24 hour(s)). No results found.  ED Clinical Impression  Upper respiratory tract infection, unspecified type  Cough   ED Assessment/Plan  [place] Narcotic database reviewed for this patient,  and feel that the risk/benefit ratio today is favorable for proceeding with a prescription for controlled substance. She has had 2 short courses of narcotics, last prescription was in April 2018.  Patient with URI. Feel that the postnasal drip is causing the cough. No evidence of pneumonia at this point in time., Saline nasal irrigation, Atrovent nasal spray, patient to start taking Mucinex D,  and if this does not make her feel better when she will switch to Claritin/Zyrtec/Allegra-D. Also, albuterol with spacer and a cough syrup. Ibuprofen 600 mg with 1 g of Tylenol 3-4 times a day as needed for pain. Follow-up with primary care physician as needed. To the ER she gets worse . Discussed  MDM, plan and followup with patient. Discussed sn/sx that should prompt return to the ED. Patient  agrees with plan.   Meds ordered this encounter  Medications  . albuterol (PROVENTIL HFA;VENTOLIN HFA) 108 (90 Base) MCG/ACT inhaler    Sig: Inhale 1-2 puffs into the lungs every 6 (six) hours as needed for wheezing or shortness of breath.    Dispense:  1 Inhaler    Refill:  0  . Spacer/Aero-Holding Chambers (AEROCHAMBER PLUS) inhaler    Sig: Use as instructed    Dispense:  1 each    Refill:  2  . ipratropium (ATROVENT) 0.06 % nasal spray    Sig: Place 2 sprays into both nostrils 4 (four) times daily. 3-4 times/ day    Dispense:  15 mL    Refill:  0  . ibuprofen (ADVIL,MOTRIN) 600 MG tablet    Sig: Take 1 tablet (600 mg total) by mouth every 6 (six) hours as needed.    Dispense:  30 tablet    Refill:  0  . chlorpheniramine-HYDROcodone (TUSSIONEX PENNKINETIC ER) 10-8 MG/5ML SUER    Sig: Take 5 mLs by mouth every 12 (twelve) hours as needed for cough.    Dispense:  120 mL    Refill:  0    *This clinic note was created using Scientist, clinical (histocompatibility and immunogenetics)Dragon dictation software. Therefore, there may be occasional mistakes despite careful proofreading.  ?   Domenick GongMortenson, Jullian Clayson, MD 06/16/17 [phone]

## 2018-01-20 ENCOUNTER — Telehealth (HOSPITAL_COMMUNITY): Payer: Self-pay

## 2018-01-20 ENCOUNTER — Encounter (HOSPITAL_COMMUNITY): Payer: Self-pay | Admitting: Family Medicine

## 2018-01-20 ENCOUNTER — Ambulatory Visit (HOSPITAL_COMMUNITY)
Admission: EM | Admit: 2018-01-20 | Discharge: 2018-01-20 | Disposition: A | Discharge status: Home / Self Care | Payer: 59 | Attending: Family Medicine | Admitting: Family Medicine

## 2018-01-20 DIAGNOSIS — J069 Acute upper respiratory infection, unspecified: Secondary | ICD-10-CM | POA: Diagnosis not present

## 2018-01-20 DIAGNOSIS — B9789 Other viral agents as the cause of diseases classified elsewhere: Secondary | ICD-10-CM

## 2018-01-20 MED ORDER — PROMETHAZINE-CODEINE 6.25-10 MG/5ML PO SYRP: 5.0000 mL | ORAL_SOLUTION | Freq: Three times a day (TID) | ORAL | 0 refills | Status: DC | PRN

## 2018-01-20 MED ORDER — ALBUTEROL SULFATE HFA 108 (90 BASE) MCG/ACT IN AERS
2.0000 | INHALATION_SPRAY | Freq: Once | RESPIRATORY_TRACT | Status: AC
  Administered 2018-01-20: 2 via RESPIRATORY_TRACT

## 2018-01-20 MED ORDER — CETIRIZINE-PSEUDOEPHEDRINE ER 5-120 MG PO TB12: 1.0000 | ORAL_TABLET | Freq: Every day | ORAL | 0 refills | Status: DC

## 2018-01-20 MED ORDER — BENZONATATE 100 MG PO CAPS: 100.0000 mg | ORAL_CAPSULE | Freq: Two times a day (BID) | ORAL | 0 refills | Status: DC | PRN

## 2018-01-20 MED ORDER — ALBUTEROL SULFATE HFA 108 (90 BASE) MCG/ACT IN AERS
INHALATION_SPRAY | RESPIRATORY_TRACT | Status: AC
  Filled 2018-01-20: qty 6.7

## 2018-01-20 NOTE — ED Provider Notes (Addendum)
[facility]   [id number] 01/20/18 Arrival Time: 1003  SUBJECTIVE:  Tracy Noble is a 52 y.o. female who presents with worsening cough for 3 days.  Denies positive sick exposure or precipitating event.  Describes cough as intermittent and dry.  Has tried mucinex without relief.  Denies aggravating factors.  Denies previous symptoms in the past.   Complains of sinus pressure, rhinorrhea, SOB, and wheezing.  Denies fever, chills, fatigue, sore throat, chest pain, nausea, vomiting, changes in bowel or bladder habits.    ROS: As per HPI.  Past Medical History:  Diagnosis Date  . Back pain   . Tobacco use    Past Surgical History:  Procedure Laterality Date  . TUBAL LIGATION     Allergies  Allergen Reactions  . Penicillins Anaphylaxis and Swelling  . Sulfa Antibiotics Swelling and Rash   No current facility-administered medications on file prior to encounter.    Current Outpatient Medications on File Prior to Encounter  Medication Sig Dispense Refill  . ibuprofen (ADVIL,MOTRIN) 600 MG tablet Take 1 tablet (600 mg total) by mouth every 6 (six) hours as needed. 30 tablet 0  . ipratropium (ATROVENT) 0.06 % nasal spray Place 2 sprays into both nostrils 4 (four) times daily. 3-4 times/ day 15 mL 0     Social History   Socioeconomic History  . Marital status: Legally Separated    Spouse name: Not on file  . Number of children: Not on file  . Years of education: Not on file  . Highest education level: Not on file  Occupational History  . Not on file  Social Needs  . Financial resource strain: Not on file  . Food insecurity:    Worry: Not on file    Inability: Not on file  . Transportation needs:    Medical: Not on file    Non-medical: Not on file  Tobacco Use  . Smoking status: Current Every Day Smoker    Packs/day: 0.50    Types: Cigarettes    Start date: 08/24/1984  . Smokeless tobacco: Never Used  Substance and Sexual Activity  . Alcohol use: No  . Drug  use: No  . Sexual activity: Not on file  Lifestyle  . Physical activity:    Days per week: Not on file    Minutes per session: Not on file  . Stress: Not on file  Relationships  . Social connections:    Talks on phone: Not on file    Gets together: Not on file    Attends religious service: Not on file    Active member of club or organization: Not on file    Attends meetings of clubs or organizations: Not on file    Relationship status: Not on file  . Intimate partner violence:    Fear of current or ex partner: Not on file    Emotionally abused: Not on file    Physically abused: Not on file    Forced sexual activity: Not on file  Other Topics Concern  . Not on file  Social History Narrative  . Not on file   Family History  Problem Relation Age of Onset  . HIV Father        IVDA     OBJECTIVE:  Vitals:   01/20/18 1016  BP: (!) 138/96  Pulse: 82  Resp: (!) 22  Temp: 98.5 F (36.9 C)  SpO2: 100%     General appearance: AOx3 in no acute distress HEENT: PERRL.  EOM grossly intact.  Sinuses nontender; mild clear rhinorrhea, right side boggy turbinate; tonsils nonerythematous, uvula midline Neck: supple without LAD Lungs: clear to auscultation bilaterally without adventitious breath sounds Heart: regular rate and rhythm.  Radial pulses 2+ symmetrical bilaterally Skin: warm and dry Psychological: alert and cooperative; normal mood and affect  No results found.  ASSESSMENT & PLAN:  1. Viral upper respiratory tract infection with cough     Meds ordered this encounter  Medications  . albuterol (PROVENTIL HFA;VENTOLIN HFA) 108 (90 Base) MCG/ACT inhaler 2 puff  . cetirizine-pseudoephedrine (ZYRTEC-D) 5-120 MG tablet    Sig: Take 1 tablet by mouth daily.    Dispense:  20 tablet    Refill:  0    Order Specific Question:   Supervising Provider    Answer:   Isa Rankin [phone]  . benzonatate (TESSALON) 100 MG capsule    Sig: Take 1 capsule (100 mg total)  by mouth 2 (two) times daily as needed for cough.    Dispense:  15 capsule    Refill:  0    Order Specific Question:   Supervising Provider    Answer:   Isa Rankin [[id number]]   Albuterol inhaler given in office.  Use as needed for shortness or breath and/ or wheezing Get plenty of rest and push fluids Tessalon Perles prescribed as needed for symptomatic relief for cough Zyrtec-D prescribed take as directed Use OTC medication as needed for symptomatic relief Follow up with PCP if symptoms persist Return or go to ER if you have any new or worsening symptoms    Patient called after visit requesting cough syrup.  Tried tessalon perles without relief.  Promethazine-codeine cough syrup sent in.     Reviewed expectations re: course of current medical issues. Questions answered. Outlined signs and symptoms indicating need for more acute intervention. Patient verbalized understanding. After Visit Summary given.          Rennis Harding, PA-C 01/20/18 1046    Alvino Chapel [place], PA-C 01/20/18 1220

## 2018-01-20 NOTE — ED Triage Notes (Signed)
Pt here for 3 days of coughing, congestion and wheezing. She has been using mucinex

## 2018-01-20 NOTE — Discharge Instructions (Addendum)
Albuterol inhaler given in office.  Use as needed for shortness or breath and/ or wheezing Get plenty of rest and push fluids Tessalon Perles prescribed as needed for symptomatic relief for cough Zyrtec-D prescribed take as directed Use OTC medication as needed for symptomatic relief Follow up with PCP if symptoms persist Return or go to ER if you have any new or worsening symptoms    Patient called after visit requesting cough syrup.  Tried tessalon perles without relief.  Promethazine-codeine cough syrup sent in.

## 2018-11-28 ENCOUNTER — Other Ambulatory Visit: Payer: Self-pay | Admitting: Obstetrics and Gynecology

## 2018-11-28 DIAGNOSIS — N6320 Unspecified lump in the left breast, unspecified quadrant: Secondary | ICD-10-CM

## 2018-12-16 ENCOUNTER — Other Ambulatory Visit: Payer: Self-pay

## 2018-12-16 ENCOUNTER — Other Ambulatory Visit: Payer: Self-pay | Admitting: Obstetrics and Gynecology

## 2018-12-16 ENCOUNTER — Ambulatory Visit
Admission: RE | Admit: 2018-12-16 | Discharge: 2018-12-16 | Disposition: A | Discharge status: Home / Self Care | Payer: 59 | Source: Ambulatory Visit | Attending: Obstetrics and Gynecology | Admitting: Obstetrics and Gynecology

## 2018-12-16 DIAGNOSIS — N6310 Unspecified lump in the right breast, unspecified quadrant: Secondary | ICD-10-CM

## 2018-12-16 DIAGNOSIS — N6320 Unspecified lump in the left breast, unspecified quadrant: Secondary | ICD-10-CM

## 2018-12-16 DIAGNOSIS — N63 Unspecified lump in unspecified breast: Secondary | ICD-10-CM

## 2019-06-23 ENCOUNTER — Other Ambulatory Visit: Payer: 59

## 2019-10-13 ENCOUNTER — Ambulatory Visit (INDEPENDENT_AMBULATORY_CARE_PROVIDER_SITE_OTHER): Payer: 59

## 2019-10-13 ENCOUNTER — Encounter (HOSPITAL_COMMUNITY): Payer: Self-pay

## 2019-10-13 ENCOUNTER — Other Ambulatory Visit: Payer: Self-pay

## 2019-10-13 ENCOUNTER — Ambulatory Visit (HOSPITAL_COMMUNITY)
Admission: EM | Admit: 2019-10-13 | Discharge: 2019-10-13 | Disposition: A | Discharge status: Home / Self Care | Payer: 59

## 2019-10-13 DIAGNOSIS — M546 Pain in thoracic spine: Secondary | ICD-10-CM | POA: Diagnosis not present

## 2019-10-13 DIAGNOSIS — M62830 Muscle spasm of back: Secondary | ICD-10-CM | POA: Diagnosis not present

## 2019-10-13 MED ORDER — HYDROCODONE-ACETAMINOPHEN 5-325 MG PO TABS: 2.0000 | ORAL_TABLET | ORAL | 0 refills | Status: DC | PRN

## 2019-10-13 MED ORDER — CYCLOBENZAPRINE HCL 10 MG PO TABS: 10.0000 mg | ORAL_TABLET | Freq: Two times a day (BID) | ORAL | 0 refills | Status: DC | PRN

## 2019-10-13 MED ORDER — KETOROLAC TROMETHAMINE 60 MG/2ML IM SOLN
60.0000 mg | Freq: Once | INTRAMUSCULAR | Status: AC
  Administered 2019-10-13: 60 mg via INTRAMUSCULAR

## 2019-10-13 MED ORDER — KETOROLAC TROMETHAMINE 60 MG/2ML IM SOLN
INTRAMUSCULAR | Status: AC
  Filled 2019-10-13: qty 2

## 2019-10-13 NOTE — ED Provider Notes (Signed)
[facility]    CSN: [id number] Arrival date & time: 10/13/19  1649      History   Chief Complaint Chief Complaint  Patient presents with  . Back Pain    HPI Tracy Noble is a 54 y.o. female.   Reports back pain from the middle of her back thoracic region, down to her tailbone.  Reports that she has been taking ibuprofen for the last 3 days for this, with little relief.  Reports that she has also been using heat with little relief.  No known injury.  Reports pain is constant, worse with walking, better with lying down.  Has history of sciatic nerve inflammation, but reports that this feels much different and is higher up than where she is previously had pain.  Denies fever, headache, shortness of breath, sore throat, chest tightness, body aches, rash, or other symptoms.  ROS as per HPI  The history is provided by the patient.  Back Pain   Past Medical History:  Diagnosis Date  . Back pain   . Tobacco use     Patient Active Problem List   Diagnosis Date Noted  . False positive serology for HIV 06/22/2014  . Tobacco use     Past Surgical History:  Procedure Laterality Date  . TUBAL LIGATION      OB History   No obstetric history on file.      Home Medications    Prior to Admission medications   Medication Sig Start Date End Date Taking? Authorizing Provider  acetaminophen (TYLENOL) 500 MG tablet Take 500 mg by mouth every 6 (six) hours as needed.   Yes [provider]  benzonatate (TESSALON) 100 MG capsule Take 1 capsule (100 mg total) by mouth 2 (two) times daily as needed for cough. 01/20/18   Wurst, [place], PA-C  cetirizine-pseudoephedrine (ZYRTEC-D) 5-120 MG tablet Take 1 tablet by mouth daily. 01/20/18   Wurst, [place], PA-C  cyclobenzaprine (FLEXERIL) 10 MG tablet Take 1 tablet (10 mg total) by mouth 2 (two) times daily as needed for muscle spasms. 10/13/19   Moshe Cipro, NP  HYDROcodone-acetaminophen (NORCO/VICODIN) 5-325 MG  tablet Take 2 tablets by mouth every 4 (four) hours as needed. 10/13/19   Moshe Cipro, NP  ibuprofen (ADVIL,MOTRIN) 600 MG tablet Take 1 tablet (600 mg total) by mouth every 6 (six) hours as needed. 06/15/17   Domenick Gong, MD  ipratropium (ATROVENT) 0.06 % nasal spray Place 2 sprays into both nostrils 4 (four) times daily. 3-4 times/ day 06/15/17   Domenick Gong, MD  promethazine-codeine [facility] WITH CODEINE) 6.25-10 MG/5ML syrup Take 5 mLs by mouth every 8 (eight) hours as needed for cough. 01/20/18   Rennis Harding, PA-C    Family History Family History  Problem Relation Age of Onset  . HIV Father        IVDA  . Breast cancer Maternal Aunt 18    Social History Social History   Tobacco Use  . Smoking status: Current Every Day Smoker    Packs/day: 0.50    Types: Cigarettes    Start date: 08/24/1984  . Smokeless tobacco: Never Used  Substance Use Topics  . Alcohol use: No  . Drug use: No     Allergies   Penicillins, Sulfa antibiotics, Sulfasalazine, Azithromycin, and Erythromycin base   Review of Systems Review of Systems  Musculoskeletal: Positive for back pain.     Physical Exam Triage Vital Signs ED Triage Vitals  Enc Vitals Group  BP 10/13/19 1701 125/85     Pulse Rate 10/13/19 1701 78     Resp 10/13/19 1701 20     Temp 10/13/19 1701 98.5 F (36.9 C)     Temp Source 10/13/19 1701 Oral     SpO2 10/13/19 1701 98 %     Weight --      Height --      Head Circumference --      Peak Flow --      Pain Score 10/13/19 1659 10     Pain Loc --      Pain Edu? --      Excl. in [place]? --    No data found.  Updated Vital Signs BP 125/85 (BP Location: Right Arm)   Pulse 78   Temp 98.5 F (36.9 C) (Oral)   Resp 20   LMP 12/19/2016 (Exact Date)   SpO2 98%   Visual Acuity Right Eye Distance:   Left Eye Distance:   Bilateral Distance:    Right Eye Near:   Left Eye Near:    Bilateral Near:     Physical Exam Vitals and nursing note  reviewed.  Constitutional:      General: She is not in acute distress.    Appearance: She is well-developed. She is obese.  HENT:     Head: Normocephalic and atraumatic.     Nose: Nose normal.  Eyes:     Conjunctiva/sclera: Conjunctivae normal.  Cardiovascular:     Rate and Rhythm: Normal rate and regular rhythm.     Heart sounds: Normal heart sounds. No murmur.  Pulmonary:     Effort: Pulmonary effort is normal. No respiratory distress.     Breath sounds: Normal breath sounds.  Abdominal:     Palpations: Abdomen is soft.     Tenderness: There is no abdominal tenderness.  Musculoskeletal:        General: Tenderness present.     Cervical back: Neck supple.       Back:     Comments: Areas of tenderness   Skin:    General: Skin is warm and dry.  Neurological:     General: No focal deficit present.     Mental Status: She is alert and oriented to person, place, and time.  Psychiatric:        Mood and Affect: Mood normal.        Behavior: Behavior normal.      UC Treatments / Results  Labs (all labs ordered are listed, but only abnormal results are displayed) Labs Reviewed - No data to display  EKG   Radiology DG Thoracic Spine 2 View  Result Date: 10/13/2019 CLINICAL DATA:  54 year old female with pain between the shoulder blade for several weeks. EXAM: THORACIC SPINE 2 VIEWS COMPARISON:  Chest radiograph dated 08/09/2013. FINDINGS: There is no acute fracture or subluxation. There is mild multilevel degenerative changes, bone spurring. There is Levocurvature of the lower thoracic spine which may be partly positional. Atherosclerotic calcification of the aortic arch noted. The soft tissues are unremarkable. IMPRESSION: 1. No acute/traumatic thoracic spine pathology. 2. Degenerative changes with lower thoracic levocurvature. Electronically Signed   By: Anner Crete M.D.   On: 10/13/2019 17:59   DG Lumbar Spine 2-3 Views  Result Date: 10/13/2019 CLINICAL DATA:  Back  pain EXAM: LUMBAR SPINE - 2-3 VIEW COMPARISON:  03/07/2014 FINDINGS: There is no evidence of lumbar spine fracture. Alignment is normal. Intervertebral disc spaces are maintained. IMPRESSION: Negative. Electronically Signed  By: Deatra Robinson M.D.   On: 10/13/2019 17:57    Procedures Procedures (including critical care time)  Medications Ordered in UC Medications  ketorolac (TORADOL) injection 60 mg (60 mg Intramuscular Given 10/13/19 1719)    Initial Impression / Assessment and Plan / UC Course  I have reviewed the triage vital signs and the nursing notes.  Pertinent labs & imaging results that were available during my care of the patient were reviewed by me and considered in my medical decision making (see chart for details).    Toradol 60 mg IM administered to patient in office today for pain control.  X-rays negative in the office today.  Flexeril and Norco prescribed to preferred pharmacy.  Instructed on how to take these medications, do not drive or operate heavy machinery.  Follow-up with orthopedics if symptoms are not improving.  Instructed to go to the ED. Final Clinical Impressions(s) / UC Diagnoses   Final diagnoses:  Muscle spasm of back  Acute midline thoracic back pain     Discharge Instructions     He received a shot of Toradol today in our office for your back pain.  I have called in a muscle relaxer Flexeril for you.  Do not take this medication while driving or operating heavy machinery until you know how it affects you.  I have also sent in a prescription for Norco for you.  This is a narcotic medication, also do not drive or operate heavy machinery while taking this medication.  Your x-ray was negative today.  Go to the emergency department if the pain becomes worse and is unrelenting, not responsive to medication, shortness of breath, loss of consciousness, loss of bowel or bladder, or other concerning symptoms.    ED Prescriptions    Medication Sig Dispense  Auth. Provider   cyclobenzaprine (FLEXERIL) 10 MG tablet Take 1 tablet (10 mg total) by mouth 2 (two) times daily as needed for muscle spasms. 20 tablet Moshe Cipro, NP   HYDROcodone-acetaminophen (NORCO/VICODIN) 5-325 MG tablet Take 2 tablets by mouth every 4 (four) hours as needed. 10 tablet Moshe Cipro, NP     I have reviewed the PDMP during this encounter.   Moshe Cipro, NP 10/13/19 [phone]

## 2019-10-13 NOTE — Discharge Instructions (Addendum)
He received a shot of Toradol today in our office for your back pain.  I have called in a muscle relaxer Flexeril for you.  Do not take this medication while driving or operating heavy machinery until you know how it affects you.  I have also sent in a prescription for Norco for you.  This is a narcotic medication, also do not drive or operate heavy machinery while taking this medication.  Your x-ray was negative today.  Go to the emergency department if the pain becomes worse and is unrelenting, not responsive to medication, shortness of breath, loss of consciousness, loss of bowel or bladder, or other concerning symptoms.

## 2019-10-13 NOTE — ED Triage Notes (Signed)
Pt reports having pain between shoulder bladesx 1 week. Pt reports the pain increase when she stand up or walk. Pt is taking Tylenol, Ibuprofen without relief.

## 2019-11-21 ENCOUNTER — Other Ambulatory Visit: Payer: Self-pay

## 2019-11-21 ENCOUNTER — Ambulatory Visit
Admission: RE | Admit: 2019-11-21 | Discharge: 2019-11-21 | Disposition: A | Discharge status: Home / Self Care | Payer: 59 | Source: Ambulatory Visit | Attending: Obstetrics and Gynecology | Admitting: Obstetrics and Gynecology

## 2019-11-21 DIAGNOSIS — N63 Unspecified lump in unspecified breast: Secondary | ICD-10-CM

## 2020-03-12 ENCOUNTER — Other Ambulatory Visit: Payer: Self-pay

## 2020-03-12 ENCOUNTER — Ambulatory Visit (HOSPITAL_COMMUNITY)
Admission: EM | Admit: 2020-03-12 | Discharge: 2020-03-12 | Disposition: A | Discharge status: Home / Self Care | Payer: 59 | Attending: Family Medicine | Admitting: Family Medicine

## 2020-03-12 ENCOUNTER — Encounter (HOSPITAL_COMMUNITY): Payer: Self-pay | Admitting: Emergency Medicine

## 2020-03-12 DIAGNOSIS — G43011 Migraine without aura, intractable, with status migrainosus: Secondary | ICD-10-CM

## 2020-03-12 MED ORDER — KETOROLAC TROMETHAMINE 30 MG/ML IJ SOLN
30.0000 mg | Freq: Once | INTRAMUSCULAR | Status: AC
  Administered 2020-03-12: 30 mg via INTRAMUSCULAR

## 2020-03-12 MED ORDER — ONDANSETRON 4 MG PO TBDP
8.0000 mg | ORAL_TABLET | Freq: Once | ORAL | Status: AC
  Administered 2020-03-12: 8 mg via ORAL

## 2020-03-12 MED ORDER — KETOROLAC TROMETHAMINE 30 MG/ML IJ SOLN
INTRAMUSCULAR | Status: AC
  Filled 2020-03-12: qty 1

## 2020-03-12 MED ORDER — ONDANSETRON 4 MG PO TBDP
ORAL_TABLET | ORAL | Status: AC
  Filled 2020-03-12: qty 2

## 2020-03-12 MED ORDER — TRAMADOL-ACETAMINOPHEN 37.5-325 MG PO TABS: 1.0000 | ORAL_TABLET | Freq: Four times a day (QID) | ORAL | 0 refills | Status: AC | PRN

## 2020-03-12 MED ORDER — DEXAMETHASONE SODIUM PHOSPHATE 10 MG/ML IJ SOLN
10.0000 mg | Freq: Once | INTRAMUSCULAR | Status: AC
  Administered 2020-03-12: 10 mg via INTRAMUSCULAR

## 2020-03-12 MED ORDER — ONDANSETRON HCL 4 MG PO TABS: 4.0000 mg | ORAL_TABLET | Freq: Four times a day (QID) | ORAL | 0 refills | Status: AC

## 2020-03-12 MED ORDER — DEXAMETHASONE SODIUM PHOSPHATE 10 MG/ML IJ SOLN
INTRAMUSCULAR | Status: AC
  Filled 2020-03-12: qty 1

## 2020-03-12 NOTE — Discharge Instructions (Signed)
Home to rest Take pain medicine as needed Use Zofran as needed for nausea Call your primary care for follow-up

## 2020-03-12 NOTE — ED Provider Notes (Signed)
[facility]    CSN: [id number] Arrival date & time: 03/12/20  1541      History   Chief Complaint Chief Complaint  Patient presents with  . Migraine    HPI Tracy Noble is a 54 y.o. female.   HPI Patient states she has not had a migraine for 2 years Currently has had a migraine for 4 days She states the headache is becoming more severe.  Photophobia.  Nausea but no vomiting.\ No numbness weakness in extremities No recent infection or sinus symptoms No head trauma She has taken ibuprofen with mild relief Previously was on Topamax for prevention but has not needed this for some time  Past Medical History:  Diagnosis Date  . Back pain   . Tobacco use     Patient Active Problem List   Diagnosis Date Noted  . False positive serology for HIV 06/22/2014  . Tobacco use     Past Surgical History:  Procedure Laterality Date  . TUBAL LIGATION      OB History   No obstetric history on file.      Home Medications    Prior to Admission medications   Medication Sig Start Date End Date Taking? Authorizing Provider  acetaminophen (TYLENOL) 500 MG tablet Take 500 mg by mouth every 6 (six) hours as needed.   Yes [provider]  ondansetron (ZOFRAN) 4 MG tablet Take 1-2 tablets (4-8 mg total) by mouth every 6 (six) hours. As needed nausea 03/12/20   Eustace Moore, MD  traMADol-acetaminophen (ULTRACET) 37.5-325 MG tablet Take 1-2 tablets by mouth every 6 (six) hours as needed. 03/12/20   Eustace Moore, MD  ipratropium (ATROVENT) 0.06 % nasal spray Place 2 sprays into both nostrils 4 (four) times daily. 3-4 times/ day 06/15/17 03/12/20  Domenick Gong, MD    Family History Family History  Problem Relation Age of Onset  . HIV Father        IVDA  . Breast cancer Maternal Aunt 41    Social History Social History   Tobacco Use  . Smoking status: Current Every Day Smoker    Packs/day: 0.50    Types: Cigarettes    Start date: 08/24/1984   . Smokeless tobacco: Never Used  Substance Use Topics  . Alcohol use: No  . Drug use: No     Allergies   Penicillins, Sulfa antibiotics, Sulfasalazine, Azithromycin, Erythromycin base, and Penicillamine   Review of Systems Review of Systems See HPI  Physical Exam Triage Vital Signs ED Triage Vitals  Enc Vitals Group     BP 03/12/20 1631 (!) 151/82     Pulse Rate 03/12/20 1629 (!) 55     Resp 03/12/20 1629 16     Temp 03/12/20 1629 98 F (36.7 C)     Temp Source 03/12/20 1629 Oral     SpO2 03/12/20 1629 100 %     Weight --      Height --      Head Circumference --      Peak Flow --      Pain Score 03/12/20 1630 8     Pain Loc --      Pain Edu? --      Excl. in [place]? --    No data found.  Updated Vital Signs BP (!) 151/82   Pulse (!) 56   Temp 98 F (36.7 C)   Resp 16   LMP 12/19/2016 (Exact Date)   SpO2 98%  Physical Exam Constitutional:      General: She is not in acute distress.    Appearance: She is well-developed.     Comments: Eyes are squinted.Marland Kitchen  Room is dark.  Appears uncomfortable  HENT:     Head: Normocephalic and atraumatic.     Nose: Nose normal. No congestion.     Mouth/Throat:     Mouth: Mucous membranes are moist.     Pharynx: No posterior oropharyngeal erythema.  Eyes:     Conjunctiva/sclera: Conjunctivae normal.     Pupils: Pupils are equal, round, and reactive to light.     Comments: Unable to do funduscopic exam due to photophobia  Cardiovascular:     Rate and Rhythm: Normal rate and regular rhythm.     Heart sounds: Normal heart sounds.  Pulmonary:     Effort: Pulmonary effort is normal. No respiratory distress.     Breath sounds: Normal breath sounds.  Abdominal:     General: There is no distension.     Palpations: Abdomen is soft.  Musculoskeletal:        General: Normal range of motion.     Cervical back: Normal range of motion and neck supple.  Skin:    General: Skin is warm and dry.  Neurological:     General: No  focal deficit present.     Mental Status: She is alert.     Motor: No weakness.     Coordination: Coordination normal.     Gait: Gait normal.     Deep Tendon Reflexes: Reflexes normal.  Psychiatric:        Behavior: Behavior normal.      UC Treatments / Results  Labs (all labs ordered are listed, but only abnormal results are displayed) Labs Reviewed - No data to display  EKG   Radiology No results found.  Procedures Procedures (including critical care time)  Medications Ordered in UC Medications  ketorolac (TORADOL) 30 MG/ML injection 30 mg (30 mg Intramuscular Given 03/12/20 1715)  dexamethasone (DECADRON) injection 10 mg (10 mg Intramuscular Given 03/12/20 1716)  ondansetron (ZOFRAN-ODT) disintegrating tablet 8 mg (8 mg Oral Given 03/12/20 1715)    Initial Impression / Assessment and Plan / UC Course  I have reviewed the triage vital signs and the nursing notes.  Pertinent labs & imaging results that were available during my care of the patient were reviewed by me and considered in my medical decision making (see chart for details).      Final Clinical Impressions(s) / UC Diagnoses   Final diagnoses:  Intractable migraine without aura and with status migrainosus     Discharge Instructions     Home to rest Take pain medicine as needed Use Zofran as needed for nausea Call your primary care for follow-up    ED Prescriptions    Medication Sig Dispense Auth. Provider   traMADol-acetaminophen (ULTRACET) 37.5-325 MG tablet Take 1-2 tablets by mouth every 6 (six) hours as needed. 20 tablet Eustace Moore, MD   ondansetron (ZOFRAN) 4 MG tablet Take 1-2 tablets (4-8 mg total) by mouth every 6 (six) hours. As needed nausea 12 tablet Eustace Moore, MD     I have reviewed the PDMP during this encounter.   Eustace Moore, MD 03/12/20 2138

## 2020-03-12 NOTE — ED Triage Notes (Signed)
PT reports headache for 4 days. Accompanied by nausea. Endorses photophobia and phonophobia. Has a history of migraines, but it has been over 10 years since her last.

## 2020-05-10 ENCOUNTER — Other Ambulatory Visit: Payer: Self-pay

## 2020-05-10 ENCOUNTER — Encounter (HOSPITAL_COMMUNITY): Payer: Self-pay

## 2020-05-10 ENCOUNTER — Ambulatory Visit (HOSPITAL_COMMUNITY)
Admission: EM | Admit: 2020-05-10 | Discharge: 2020-05-10 | Disposition: A | Discharge status: Home / Self Care | Payer: 59 | Source: Ambulatory Visit | Attending: Emergency Medicine | Admitting: Emergency Medicine

## 2020-05-10 VITALS — BP 131/91 | HR 71 | Temp 98.7°F | Resp 18

## 2020-05-10 DIAGNOSIS — L299 Pruritus, unspecified: Secondary | ICD-10-CM | POA: Diagnosis not present

## 2020-05-10 DIAGNOSIS — K047 Periapical abscess without sinus: Secondary | ICD-10-CM | POA: Diagnosis not present

## 2020-05-10 MED ORDER — TRAMADOL HCL 50 MG PO TABS: 50.0000 mg | ORAL_TABLET | Freq: Four times a day (QID) | ORAL | 0 refills | Status: AC | PRN

## 2020-05-10 MED ORDER — CEPHALEXIN 500 MG PO CAPS: 500.0000 mg | ORAL_CAPSULE | Freq: Four times a day (QID) | ORAL | 0 refills | Status: AC

## 2020-05-10 NOTE — Discharge Instructions (Addendum)
Keep your appointment with your dentist as scheduled.  Rinse with warm salt water after meals and at bedtime.  You can use OTC Tylenol and Ibuprofen as needed for pain and supplement with Tramadol as needed for severe pain.  For the facial itching you can use OTC Benadryl cream- do not get the cream in your eyes.

## 2020-05-10 NOTE — ED Triage Notes (Signed)
Patient has experienced toothache on the left upper side x 3 days. Patient had previous prescription of Cephalexin 500mg  from dentist noted to be from 2015. Patient states she took the remaining 2 capsules.Patient has dental appointment scheduled for next Thursday. Patient also has complaints of itching to face and discoloration.

## 2020-05-10 NOTE — ED Provider Notes (Signed)
[facility]    CSN: [id number] Arrival date & time: 05/10/20  1644      History   Chief Complaint Chief Complaint  Patient presents with  . Dental Pain  . Facial itching    HPI Tracy Noble is a 54 y.o. female.   54 yo female here for evaluation of pain and swelling to her gum on the upper left x 3 days. The swelling is at the site of where a tooth was pulled previously. She denies fever or drainage. Appointment scheduled with her dentist 6 days from now. She has treated with an old Keflex perscription from a previous dental infection from 16.       Past Medical History:  Diagnosis Date  . Back pain   . Tobacco use     Patient Active Problem List   Diagnosis Date Noted  . False positive serology for HIV 06/22/2014  . Tobacco use     Past Surgical History:  Procedure Laterality Date  . TUBAL LIGATION      OB History   No obstetric history on file.      Home Medications    Prior to Admission medications   Medication Sig Start Date End Date Taking? Authorizing Provider  acetaminophen (TYLENOL) 500 MG tablet Take 500 mg by mouth every 6 (six) hours as needed.    [provider]  ondansetron (ZOFRAN) 4 MG tablet Take 1-2 tablets (4-8 mg total) by mouth every 6 (six) hours. As needed nausea 03/12/20   Eustace Moore, MD  traMADol-acetaminophen (ULTRACET) 37.5-325 MG tablet Take 1-2 tablets by mouth every 6 (six) hours as needed. 03/12/20   Eustace Moore, MD  ipratropium (ATROVENT) 0.06 % nasal spray Place 2 sprays into both nostrils 4 (four) times daily. 3-4 times/ day 06/15/17 03/12/20  Domenick Gong, MD    Family History Family History  Problem Relation Age of Onset  . HIV Father        IVDA  . Breast cancer Maternal Aunt 32    Social History Social History   Tobacco Use  . Smoking status: Current Every Day Smoker    Packs/day: 0.50    Types: Cigarettes    Start date: 08/24/1984  . Smokeless tobacco: Never Used    Substance Use Topics  . Alcohol use: No  . Drug use: No     Allergies   Penicillins, Sulfa antibiotics, Sulfasalazine, Azithromycin, Erythromycin base, and Penicillamine   Review of Systems Review of Systems  Constitutional: Negative for activity change, appetite change, chills and fever.  HENT: Positive for dental problem and facial swelling. Negative for congestion, drooling and mouth sores.   Respiratory: Negative for cough and shortness of breath.   Gastrointestinal: Negative for nausea.  Skin:       Itching around eyes and on cheeks.      Physical Exam Triage Vital Signs ED Triage Vitals [05/10/20 1728]  Enc Vitals Group     BP (!) 131/91     Pulse Rate 71     Resp 18     Temp 98.7 F (37.1 C)     Temp Source Oral     SpO2 100 %     Weight      Height      Head Circumference      Peak Flow      Pain Score 6     Pain Loc      Pain Edu?      Excl.  in [place]?    No data found.  Updated Vital Signs BP (!) 131/91 (BP Location: Left Arm)   Pulse 71   Temp 98.7 F (37.1 C) (Oral)   Resp 18   LMP 12/19/2016 (Exact Date)   SpO2 100%   Visual Acuity Right Eye Distance:   Left Eye Distance:   Bilateral Distance:    Right Eye Near:   Left Eye Near:    Bilateral Near:     Physical Exam Vitals and nursing note reviewed.  Constitutional:      Appearance: Normal appearance.  HENT:     Head: Normocephalic and atraumatic.     Nose: Nose normal.     Mouth/Throat:     Mouth: Mucous membranes are moist.     Dentition: Gum lesions present.     Tongue: Lesions present. Tongue deviates from midline.     Palate: Mass and lesions present.     Pharynx: Oropharynx is clear.     Comments: There is a swollen area with multiple white heads that has formed where a molar had once been. The area is pink and tender to touch. No drainage but is fluctuant. Hard palate is soft without erythema or edema.  Eyes:     Conjunctiva/sclera: Conjunctivae normal.     Pupils: Pupils  are equal, round, and reactive to light.  Musculoskeletal:     Cervical back: Normal range of motion and neck supple.  Skin:    General: Skin is warm and dry.  Neurological:     General: No focal deficit present.     Mental Status: She is alert and oriented to person, place, and time. Mental status is at baseline.  Psychiatric:        Mood and Affect: Mood normal.        Behavior: Behavior normal.        Thought Content: Thought content normal.        Judgment: Judgment normal.      UC Treatments / Results  Labs (all labs ordered are listed, but only abnormal results are displayed) Labs Reviewed - No data to display  EKG   Radiology No results found.  Procedures Procedures (including critical care time)  Medications Ordered in UC Medications - No data to display  Initial Impression / Assessment and Plan / UC Course  I have reviewed the triage vital signs and the nursing notes.  Pertinent labs & imaging results that were available during my care of the patient were reviewed by me and considered in my medical decision making (see chart for details).   Patient presents for pain and swelling to her gum where a molar had been that was pulled 3-4 years ago. Apparent abscess formation- will have her see her dentist as scheduled in 6 days and treat her with Keflex and Tramadol for pain- she can supplement with tylenol and ibuprofen.    Final Clinical Impressions(s) / UC Diagnoses   Final diagnoses:  None   Discharge Instructions   None    ED Prescriptions    None     PDMP not reviewed this encounter.   Becky August[place], NP 05/10/20 [phone]

## 2020-06-25 ENCOUNTER — Emergency Department (HOSPITAL_COMMUNITY): Payer: 59

## 2020-06-25 ENCOUNTER — Emergency Department (HOSPITAL_COMMUNITY)
Admission: EM | Admit: 2020-06-25 | Discharge: 2020-06-26 | Disposition: A | Discharge status: Home / Self Care | Payer: 59 | Attending: Emergency Medicine | Admitting: Emergency Medicine

## 2020-06-25 ENCOUNTER — Encounter (HOSPITAL_COMMUNITY): Payer: Self-pay | Admitting: *Deleted

## 2020-06-25 ENCOUNTER — Ambulatory Visit (HOSPITAL_COMMUNITY)
Admission: EM | Admit: 2020-06-25 | Discharge: 2020-06-25 | Disposition: A | Discharge status: Home / Self Care | Payer: 59 | Attending: Family Medicine | Admitting: Family Medicine

## 2020-06-25 ENCOUNTER — Ambulatory Visit (INDEPENDENT_AMBULATORY_CARE_PROVIDER_SITE_OTHER): Payer: 59

## 2020-06-25 ENCOUNTER — Other Ambulatory Visit: Payer: Self-pay

## 2020-06-25 DIAGNOSIS — R030 Elevated blood-pressure reading, without diagnosis of hypertension: Secondary | ICD-10-CM

## 2020-06-25 DIAGNOSIS — R079 Chest pain, unspecified: Secondary | ICD-10-CM | POA: Diagnosis not present

## 2020-06-25 DIAGNOSIS — R0789 Other chest pain: Secondary | ICD-10-CM | POA: Diagnosis not present

## 2020-06-25 DIAGNOSIS — F17210 Nicotine dependence, cigarettes, uncomplicated: Secondary | ICD-10-CM | POA: Diagnosis not present

## 2020-06-25 DIAGNOSIS — R0602 Shortness of breath: Secondary | ICD-10-CM | POA: Diagnosis not present

## 2020-06-25 DIAGNOSIS — R072 Precordial pain: Secondary | ICD-10-CM | POA: Diagnosis present

## 2020-06-25 LAB — CBC
HCT: 46 % (ref 36.0–46.0)
Hemoglobin: 15.1 g/dL — ABNORMAL HIGH (ref 12.0–15.0)
MCH: 30.8 pg (ref 26.0–34.0)
MCHC: 32.8 g/dL (ref 30.0–36.0)
MCV: 93.9 fL (ref 80.0–100.0)
Platelets: 227 10*3/uL (ref 150–400)
RBC: 4.9 MIL/uL (ref 3.87–5.11)
RDW: 12.7 % (ref 11.5–15.5)
WBC: 7.4 10*3/uL (ref 4.0–10.5)
nRBC: 0 % (ref 0.0–0.2)

## 2020-06-25 LAB — BASIC METABOLIC PANEL
Anion gap: 11 (ref 5–15)
BUN: 11 mg/dL (ref 6–20)
CO2: 23 mmol/L (ref 22–32)
Calcium: 10.1 mg/dL (ref 8.9–10.3)
Chloride: 103 mmol/L (ref 98–111)
Creatinine, Ser: 1.1 mg/dL — ABNORMAL HIGH (ref 0.44–1.00)
GFR, Estimated: >60 mL/min (ref >60)
Glucose, Bld: 94 mg/dL (ref 70–99)
Potassium: 4.3 mmol/L (ref 3.5–5.1)
Sodium: 137 mmol/L (ref 135–145)

## 2020-06-25 LAB — TROPONIN I (HIGH SENSITIVITY)
Troponin I (High Sensitivity): <2 ng/L (ref <18)
Troponin I (High Sensitivity): 2 ng/L (ref <18)

## 2020-06-25 LAB — I-STAT BETA HCG BLOOD, ED (MC, WL, AP ONLY): I-stat hCG, quantitative: <5 m[IU]/mL (ref <5)

## 2020-06-25 NOTE — ED Notes (Signed)
Patient is being discharged from the Urgent Care and sent to the Emergency Department via POV.  Per Dr. Tracie Harrier, patient is in need of higher level of care due to CP Patient is aware and verbalizes understanding of plan of care.  Vitals:   06/25/20 1616  BP: (!) 166/93  Pulse: 65  Resp: 18  Temp: 98.3 F (36.8 C)  SpO2: 100%

## 2020-06-25 NOTE — ED Notes (Signed)
Pt refused vitals recheck stating that she has a headache and this is "all too much."

## 2020-06-25 NOTE — ED Triage Notes (Signed)
Pt reports waking this AM with upper chest pressure.  States pressure has been constant throughout the day; pressure is worse with deep breathing, movement and palpation.  Pt visibly upset and crying, states feeling anxious.  Pt was able to become calm with reassurance.  EKG shown to Dr. Tracie Harrier.

## 2020-06-25 NOTE — Discharge Instructions (Addendum)
Your blood pressure was noted to be elevated during your visit today. If you are currently taking medication for high blood pressure, please ensure you are taking this as directed. If you do not have a history of high blood pressure and your blood pressure remains persistently elevated, you may need to begin taking a medication at some point. You may return here within the next few days to recheck if unable to see your primary care provider or if do not have a one.  BP (!) 166/93   Pulse 65   Temp 98.3 F (36.8 C)   Resp 18   LMP 12/19/2016 (Exact Date)   SpO2 100%

## 2020-06-25 NOTE — ED Notes (Signed)
Pt refused VS x2

## 2020-06-25 NOTE — ED Triage Notes (Signed)
C/O chest pain that started at 1 pm, stated either a palpitations or her heart slowing down woke her up, and she's been having heavy pressure since then

## 2020-06-26 NOTE — ED Provider Notes (Signed)
[facility] EMERGENCY DEPARTMENT Provider Note   CSN: [id number] Arrival date & time: 06/25/20  1835     History Chief Complaint  Patient presents with  . Chest Pain    Tracy Noble is a 54 y.o. female.  54 yo F with a chief complaint of chest pain.  This is centralized and feels like a pressure.  Started last night when she woke up suddenly from sleep and felt like her heart was either racing or going very slow and she had a pressure there afterwards.  Seem to get somewhat better and then reoccurred this afternoon about 1 PM.  States she feels like a pressure across her chest.  Worse with standing and walking.  She denies cough congestion or fever denies trauma to the chest.  Has some mild shortness of breath with it.  Patient denies history of MI, denies hypertension hyperlipidemia diabetes.  Current smoker.  Denies family history of MI.  Patient denies history of PE or DVT denies hemoptysis denies unilateral lower extremity edema denies recent surgery immobilization hospitalization estrogen use or history of cancer.   The history is provided by the patient.  Chest Pain Pain location:  Substernal area Pain quality: aching and sharp   Pain radiates to:  Does not radiate Pain severity:  Moderate Onset quality:  Gradual Duration:  1 day Timing:  Constant Progression:  Worsening Chronicity:  New Relieved by:  Nothing Exacerbated by: standing, walking. Ineffective treatments:  None tried Associated symptoms: shortness of breath   Associated symptoms: no dizziness, no fever, no headache, no nausea, no palpitations and no vomiting        Past Medical History:  Diagnosis Date  . Back pain   . Tobacco use     Patient Active Problem List   Diagnosis Date Noted  . False positive serology for HIV 06/22/2014  . Tobacco use     Past Surgical History:  Procedure Laterality Date  . TUBAL LIGATION       OB History   No obstetric history on file.      Family History  Problem Relation Age of Onset  . HIV Father        IVDA  . Breast cancer Maternal Aunt 40  . Healthy Mother     Social History   Tobacco Use  . Smoking status: Current Every Day Smoker    Packs/day: 0.50    Types: Cigarettes    Start date: 08/24/1984  . Smokeless tobacco: Never Used  Vaping Use  . Vaping Use: Never used  Substance Use Topics  . Alcohol use: No  . Drug use: No    Home Medications Prior to Admission medications   Medication Sig Start Date End Date Taking? Authorizing Provider  acetaminophen (TYLENOL) 500 MG tablet Take 500 mg by mouth every 6 (six) hours as needed.    [provider]  cephALEXin (KEFLEX) 500 MG capsule Take 1 capsule (500 mg total) by mouth 4 (four) times daily. 05/10/20   Becky Augusta, NP  ondansetron (ZOFRAN) 4 MG tablet Take 1-2 tablets (4-8 mg total) by mouth every 6 (six) hours. As needed nausea 03/12/20   Eustace Moore, MD  traMADol-acetaminophen (ULTRACET) 37.5-325 MG tablet Take 1-2 tablets by mouth every 6 (six) hours as needed. 03/12/20   Eustace Moore, MD  ipratropium (ATROVENT) 0.06 % nasal spray Place 2 sprays into both nostrils 4 (four) times daily. 3-4 times/ day 06/15/17 03/12/20  Domenick Gong, MD  Allergies    Penicillins, Sulfa antibiotics, Sulfasalazine, Azithromycin, Erythromycin base, and Penicillamine  Review of Systems   Review of Systems  Constitutional: Negative for chills and fever.  HENT: Negative for congestion and rhinorrhea.   Eyes: Negative for redness and visual disturbance.  Respiratory: Positive for shortness of breath. Negative for wheezing.   Cardiovascular: Positive for chest pain. Negative for palpitations.  Gastrointestinal: Negative for nausea and vomiting.  Genitourinary: Negative for dysuria and urgency.  Musculoskeletal: Negative for arthralgias and myalgias.  Skin: Negative for pallor and wound.  Neurological: Negative for dizziness and headaches.     Physical Exam Updated Vital Signs BP (!) 147/89 (BP Location: Left Arm)   Pulse (!) 50   Temp 98.1 F (36.7 C)   Resp 16   LMP 12/19/2016 (Exact Date)   SpO2 99%   Physical Exam Vitals and nursing note reviewed.  Constitutional:      General: She is not in acute distress.    Appearance: She is well-developed. She is not diaphoretic.  HENT:     Head: Normocephalic and atraumatic.  Eyes:     Pupils: Pupils are equal, round, and reactive to light.  Cardiovascular:     Rate and Rhythm: Normal rate and regular rhythm.     Heart sounds: No murmur heard.  No friction rub. No gallop.   Pulmonary:     Effort: Pulmonary effort is normal.     Breath sounds: No wheezing or rales.  Chest:     Chest wall: Tenderness (about the sternum, reproduces symptoms) present.  Abdominal:     General: There is no distension.     Palpations: Abdomen is soft.     Tenderness: There is no abdominal tenderness.  Musculoskeletal:        General: No tenderness.     Cervical back: Normal range of motion and neck supple.  Skin:    General: Skin is warm and dry.  Neurological:     Mental Status: She is alert and oriented to person, place, and time.  Psychiatric:        Behavior: Behavior normal.     ED Results / Procedures / Treatments   Labs (all labs ordered are listed, but only abnormal results are displayed) Labs Reviewed  BASIC METABOLIC PANEL - Abnormal; Notable for the following components:      Result Value   Creatinine, Ser 1.10 (*)    All other components within normal limits  CBC - Abnormal; Notable for the following components:   Hemoglobin 15.1 (*)    All other components within normal limits  I-STAT BETA HCG BLOOD, ED ([facility], [facility], [facility] ONLY)  TROPONIN I (HIGH SENSITIVITY)  TROPONIN I (HIGH SENSITIVITY)    EKG EKG Interpretation  Date/Time:  Tuesday June 25 2020 18:41:24 EDT Ventricular Rate:  59 PR Interval:  144 QRS Duration: 74 QT Interval:  426 QTC  Calculation: 421 R Axis:   76 Text Interpretation: Sinus bradycardia Cannot rule out Anterior infarct , age undetermined Abnormal ECG No significant change since last tracing Confirmed by Melene Plan [phone]) on 06/26/2020 1:44:39 AM   Radiology DG Chest 2 View  Result Date: 06/25/2020 CLINICAL DATA:  Chest pain.  Awoke with chest pressure. EXAM: CHEST - 2 VIEW COMPARISON:  Radiograph 1-1/2 hours prior FINDINGS: The cardiomediastinal contours are normal. The lungs are clear. Pulmonary vasculature is normal. No consolidation, pleural effusion, or pneumothorax. No acute osseous abnormalities are seen. IMPRESSION: Negative radiographs of the chest. Electronically Signed  By: Narda Rutherford M.D.   On: 06/25/2020 19:19   DG Chest 2 View  Result Date: 06/25/2020 CLINICAL DATA:  Chest pain for 1 day EXAM: CHEST - 2 VIEW COMPARISON:  08/09/2013 FINDINGS: The heart size and mediastinal contours are within normal limits. Both lungs are clear. The visualized skeletal structures are unremarkable. IMPRESSION: No active cardiopulmonary disease. Electronically Signed   By: Alcide Clever M.D.   On: 06/25/2020 18:04    Procedures Procedures (including critical care time) Discussed smoking cessation with patient and was they were offerred resources to help stop.  Total time was 5 min CPT code 25852.   Medications Ordered in ED Medications - No data to display  ED Course  I have reviewed the triage vital signs and the nursing notes.  Pertinent labs & imaging results that were available during my care of the patient were reviewed by me and considered in my medical decision making (see chart for details).    MDM Rules/Calculators/A&P                          54 yo F with a chief complaint of chest pain.  This is atypical in nature and reproduced on exam.  She has a delta troponin that is negative.  EKG without concerning finding.  Chest x-ray viewed by me without focal infiltrate or pneumothorax.  She is  PERC negative.  Most likely this is musculoskeletal.  Will treat as such.  PCP follow-up.  2:28 AM:  I have discussed the diagnosis/risks/treatment options with the patient and believe the pt to be eligible for discharge home to follow-up with PCP. We also discussed returning to the ED immediately if new or worsening sx occur. We discussed the sx which are most concerning (e.g., sudden worsening pain, fever, inability to tolerate by mouth) that necessitate immediate return. Medications administered to the patient during their visit and any new prescriptions provided to the patient are listed below.  Medications given during this visit Medications - No data to display   The patient appears reasonably screen and/or stabilized for discharge and I doubt any other medical condition or other [facility] requiring further screening, evaluation, or treatment in the ED at this time prior to discharge.   Final Clinical Impression(s) / ED Diagnoses Final diagnoses:  Chest wall pain    Rx / DC Orders ED Discharge Orders    None       Melene Plan, DO 06/26/20 0228

## 2020-06-26 NOTE — ED Provider Notes (Signed)
[facility]   [id number] 06/25/20 Arrival Time: 1609  ASSESSMENT & PLAN:  1. Chest pain, unspecified type   2. Elevated blood pressure reading without diagnosis of hypertension     ECG: Performed today and interpreted by me: normal EKG, normal sinus rhythm. No STEMI.  I have personally viewed the imaging studies ordered this visit. No acute findings on CXR.    Discharge Instructions     Your blood pressure was noted to be elevated during your visit today. If you are currently taking medication for high blood pressure, please ensure you are taking this as directed. If you do not have a history of high blood pressure and your blood pressure remains persistently elevated, you may need to begin taking a medication at some point. You may return here within the next few days to recheck if unable to see your primary care provider or if do not have a one.  BP (!) 166/93   Pulse 65   Temp 98.3 F (36.8 C)   Resp 18   LMP 12/19/2016 (Exact Date)   SpO2 100%       Discussed likely MSK etiology but cannot r/o possible cardiac nature without Troponin. She elects ED evaluation. Stable upon d/c.  Reviewed expectations re: course of current medical issues. Questions answered. Outlined signs and symptoms indicating need for more acute intervention. Patient verbalized understanding. After Visit Summary given.   SUBJECTIVE:  History from: patient. Tracy Noble is a 54 y.o. female who presents with complaint of fairly persistent anterior chest pain; upper mid chest; over the past 18 hours; worse with movement and deep breaths. No assoc SOB. No injury. Afebrile. No recent illnesses. No home tx.   Social History   Tobacco Use  Smoking Status Current Every Day Smoker  . Packs/day: 0.50  . Types: Cigarettes  . Start date: 08/24/1984  Smokeless Tobacco Never Used   Social History   Substance and Sexual Activity  Alcohol Use No   Denies illegal drug  use.   OBJECTIVE:  Vitals:   06/25/20 1616  BP: (!) 166/93  Pulse: 65  Resp: 18  Temp: 98.3 F (36.8 C)  SpO2: 100%    General appearance: alert, oriented, no acute distress but appears anxious Eyes: PERRLA; EOMI; conjunctivae normal HENT: normocephalic; atraumatic Neck: supple with FROM Lungs: without labored respirations; speaks full sentences without difficulty; CTAB Heart: regular rate and rhythm Chest Wall: with tenderness to palpation Abdomen: soft, non-tender; no guarding or rebound tenderness Extremities: with edema; without calf swelling or tenderness; symmetrical without gross deformities Skin: warm and dry; without rash or lesions Neuro: normal gait Psychological: alert and cooperative; normal mood and affect   Imaging: DG Chest 2 View  Result Date: 06/25/2020 CLINICAL DATA:  Chest pain for 1 day EXAM: CHEST - 2 VIEW COMPARISON:  08/09/2013 FINDINGS: The heart size and mediastinal contours are within normal limits. Both lungs are clear. The visualized skeletal structures are unremarkable. IMPRESSION: No active cardiopulmonary disease. Electronically Signed   By: Alcide Clever M.D.   On: 06/25/2020 18:04     Allergies  Allergen Reactions  . Penicillins Anaphylaxis and Swelling  . Sulfa Antibiotics Swelling and Rash  . Sulfasalazine Rash and Swelling  . Azithromycin Nausea And Vomiting  . Erythromycin Base   . Penicillamine     Past Medical History:  Diagnosis Date  . Back pain   . Tobacco use    Social History   Socioeconomic History  . Marital status: Legally  Separated    Spouse name: Not on file  . Number of children: Not on file  . Years of education: Not on file  . Highest education level: Not on file  Occupational History  . Not on file  Tobacco Use  . Smoking status: Current Every Day Smoker    Packs/day: 0.50    Types: Cigarettes    Start date: 08/24/1984  . Smokeless tobacco: Never Used  Vaping Use  . Vaping Use: Never used  Substance  and Sexual Activity  . Alcohol use: No  . Drug use: No  . Sexual activity: Not on file  Other Topics Concern  . Not on file  Social History Narrative  . Not on file   Social Determinants of Health   Financial Resource Strain:   . Difficulty of Paying Living Expenses: Not on file  Food Insecurity:   . Worried About Programme researcher, broadcasting/film/video in the Last Year: Not on file  . Ran Out of Food in the Last Year: Not on file  Transportation Needs:   . Lack of Transportation (Medical): Not on file  . Lack of Transportation (Non-Medical): Not on file  Physical Activity:   . Days of Exercise per Week: Not on file  . Minutes of Exercise per Session: Not on file  Stress:   . Feeling of Stress : Not on file  Social Connections:   . Frequency of Communication with Friends and Family: Not on file  . Frequency of Social Gatherings with Friends and Family: Not on file  . Attends Religious Services: Not on file  . Active Member of Clubs or Organizations: Not on file  . Attends Banker Meetings: Not on file  . Marital Status: Not on file  Intimate Partner Violence:   . Fear of Current or Ex-Partner: Not on file  . Emotionally Abused: Not on file  . Physically Abused: Not on file  . Sexually Abused: Not on file   Family History  Problem Relation Age of Onset  . HIV Father        IVDA  . Breast cancer Maternal Aunt 40  . Healthy Mother    Past Surgical History:  Procedure Laterality Date  . TUBAL LIGATION       Mardella Layman, MD 06/26/20 1255

## 2020-06-26 NOTE — Discharge Instructions (Signed)
Take 4 over the counter ibuprofen tablets 3 times a day or 2 over-the-counter naproxen tablets twice a day for pain. Also take tylenol 1000mg(2 extra strength) four times a day.    

## 2021-01-13 ENCOUNTER — Emergency Department (HOSPITAL_COMMUNITY): Payer: BLUE CROSS/BLUE SHIELD

## 2021-01-13 ENCOUNTER — Encounter (HOSPITAL_COMMUNITY): Payer: Self-pay

## 2021-01-13 ENCOUNTER — Emergency Department (HOSPITAL_COMMUNITY)
Admission: EM | Admit: 2021-01-13 | Discharge: 2021-01-13 | Disposition: A | Discharge status: Home / Self Care | Payer: BLUE CROSS/BLUE SHIELD | Attending: Emergency Medicine | Admitting: Emergency Medicine

## 2021-01-13 ENCOUNTER — Encounter (HOSPITAL_COMMUNITY): Payer: Self-pay | Admitting: Emergency Medicine

## 2021-01-13 ENCOUNTER — Other Ambulatory Visit: Payer: Self-pay

## 2021-01-13 ENCOUNTER — Ambulatory Visit (HOSPITAL_COMMUNITY)
Admission: EM | Admit: 2021-01-13 | Discharge: 2021-01-13 | Disposition: A | Discharge status: Home / Self Care | Payer: BLUE CROSS/BLUE SHIELD

## 2021-01-13 DIAGNOSIS — R079 Chest pain, unspecified: Secondary | ICD-10-CM | POA: Diagnosis not present

## 2021-01-13 DIAGNOSIS — M546 Pain in thoracic spine: Secondary | ICD-10-CM

## 2021-01-13 DIAGNOSIS — R531 Weakness: Secondary | ICD-10-CM | POA: Insufficient documentation

## 2021-01-13 DIAGNOSIS — R55 Syncope and collapse: Secondary | ICD-10-CM | POA: Insufficient documentation

## 2021-01-13 DIAGNOSIS — F17210 Nicotine dependence, cigarettes, uncomplicated: Secondary | ICD-10-CM | POA: Diagnosis not present

## 2021-01-13 DIAGNOSIS — R42 Dizziness and giddiness: Secondary | ICD-10-CM | POA: Insufficient documentation

## 2021-01-13 LAB — TROPONIN I (HIGH SENSITIVITY)
Troponin I (High Sensitivity): 3 ng/L (ref <18)
Troponin I (High Sensitivity): 3 ng/L (ref <18)

## 2021-01-13 LAB — BASIC METABOLIC PANEL
Anion gap: 7 (ref 5–15)
BUN: 13 mg/dL (ref 6–20)
CO2: 26 mmol/L (ref 22–32)
Calcium: 9.4 mg/dL (ref 8.9–10.3)
Chloride: 106 mmol/L (ref 98–111)
Creatinine, Ser: 1.13 mg/dL — ABNORMAL HIGH (ref 0.44–1.00)
GFR, Estimated: 58 mL/min — ABNORMAL LOW (ref >60)
Glucose, Bld: 92 mg/dL (ref 70–99)
Potassium: 5 mmol/L (ref 3.5–5.1)
Sodium: 139 mmol/L (ref 135–145)

## 2021-01-13 LAB — CBC
HCT: 46 % (ref 36.0–46.0)
Hemoglobin: 14.8 g/dL (ref 12.0–15.0)
MCH: 31 pg (ref 26.0–34.0)
MCHC: 32.2 g/dL (ref 30.0–36.0)
MCV: 96.2 fL (ref 80.0–100.0)
Platelets: 241 10*3/uL (ref 150–400)
RBC: 4.78 MIL/uL (ref 3.87–5.11)
RDW: 13 % (ref 11.5–15.5)
WBC: 6.1 10*3/uL (ref 4.0–10.5)
nRBC: 0 % (ref 0.0–0.2)

## 2021-01-13 LAB — D-DIMER, QUANTITATIVE: D-Dimer, Quant: 0.34 ug/mL-FEU (ref 0.00–0.50)

## 2021-01-13 MED ORDER — OXYCODONE-ACETAMINOPHEN 5-325 MG PO TABS: 1.0000 | ORAL_TABLET | Freq: Once | ORAL | Status: DC

## 2021-01-13 MED ORDER — METHOCARBAMOL 500 MG PO TABS
500.0000 mg | ORAL_TABLET | Freq: Once | ORAL | Status: AC
  Administered 2021-01-13: 500 mg via ORAL
  Filled 2021-01-13: qty 1

## 2021-01-13 MED ORDER — LIDOCAINE 5 % EX PTCH
1.0000 | MEDICATED_PATCH | CUTANEOUS | Status: DC
  Administered 2021-01-13: 1 via TRANSDERMAL
  Filled 2021-01-13: qty 1

## 2021-01-13 MED ORDER — LIDOCAINE 5 % EX PTCH: 1.0000 | MEDICATED_PATCH | CUTANEOUS | 0 refills | Status: AC

## 2021-01-13 MED ORDER — METHOCARBAMOL 500 MG PO TABS: 500.0000 mg | ORAL_TABLET | Freq: Three times a day (TID) | ORAL | 0 refills | Status: AC | PRN

## 2021-01-13 MED ORDER — METHOCARBAMOL 500 MG PO TABS: 500.0000 mg | ORAL_TABLET | Freq: Three times a day (TID) | ORAL | 0 refills | Status: DC | PRN

## 2021-01-13 MED ORDER — LIDOCAINE 5 % EX PTCH: 1.0000 | MEDICATED_PATCH | CUTANEOUS | 0 refills | Status: DC

## 2021-01-13 MED ORDER — ACETAMINOPHEN 500 MG PO TABS
1000.0000 mg | ORAL_TABLET | Freq: Once | ORAL | Status: DC
  Filled 2021-01-13: qty 2

## 2021-01-13 NOTE — ED Provider Notes (Signed)
[facility]  ____________________________________________  Time seen: Approximately 9:21 AM  I have reviewed the triage vital signs and the nursing notes.   HISTORY  Chief Complaint Shoulder Pain and Loss of Consciousness    HPI Tracy Noble is a 55 y.o. female who presents to the urgent care complaining of left back pain radiating into her chest with positive syncope.  Patient states that 3 days ago, she was on lunch break, went to her car and states that she believes she overheated and then passed out.  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  Patient states that time she felt "off" and went home.  She states that she started to develop some back pain that evening into the next day.  It is slightly pleuritic in nature, radiates from the back into the chest.  Does not radiate into the neck, jaw or down the arm.  She has had no palpitations.  No shortness of breath.  No recent URI symptoms.  Patient has no personal history of cardiac issues and no close family members with early cardiac demise.  Patient has tried multiple over-the-counter medications plus a prescription of tramadol with no improvement of the pain.  She states that the pain is worsening.  Patient states that she just feels "off."   Patient describes the pain in the left posterior ribs.  She states that she had multiple family members push on the area with no tenderness.  No lower back pain.  No urinary or GI complaints.        Past Medical History:  Diagnosis Date  . Back pain   . Tobacco use     Patient Active Problem List   Diagnosis Date Noted  . False positive serology for HIV 06/22/2014  . Tobacco use     Past Surgical History:  Procedure Laterality Date  . TUBAL LIGATION      Prior to Admission medications   Medication Sig Start Date End Date Taking? Authorizing Provider  acetaminophen (TYLENOL) 500 MG tablet Take 500 mg by mouth every 6 (six) hours as  needed.    [provider]  cephALEXin (KEFLEX) 500 MG capsule Take 1 capsule (500 mg total) by mouth 4 (four) times daily. 05/10/20   Becky Augusta, NP  ondansetron (ZOFRAN) 4 MG tablet Take 1-2 tablets (4-8 mg total) by mouth every 6 (six) hours. As needed nausea 03/12/20   Eustace Moore, MD  traMADol-acetaminophen (ULTRACET) 37.5-325 MG tablet Take 1-2 tablets by mouth every 6 (six) hours as needed. 03/12/20   Eustace Moore, MD  ipratropium (ATROVENT) 0.06 % nasal spray Place 2 sprays into both nostrils 4 (four) times daily. 3-4 times/ day 06/15/17 03/12/20  Domenick Gong, MD    Allergies Penicillins, Sulfa antibiotics, Sulfasalazine, Azithromycin, Erythromycin base, and Penicillamine  Family History  Problem Relation Age of Onset  . HIV Father        IVDA  . Breast cancer Maternal Aunt 40  . Healthy Mother     Social History Social History   Tobacco Use  . Smoking status: Current Every Day Smoker    Packs/day: 0.50    Types: Cigarettes    Start date: 08/24/1984  . Smokeless tobacco: Never Used  Vaping Use  . Vaping Use: Never used  Substance Use Topics  . Alcohol use: No  . Drug use: No     Review of Systems  Constitutional: No fever/chills Eyes: No visual  changes. No discharge ENT: No upper respiratory complaints. Cardiovascular: no chest pain. Respiratory: no cough. No SOB. Gastrointestinal: No abdominal pain.  No nausea, no vomiting.  No diarrhea.  No constipation. Genitourinary: Negative for dysuria. No hematuria Musculoskeletal: Left-sided upper back pain radiating into the chest. Skin: Negative for rash, abrasions, lacerations, ecchymosis. Neurological: Negative for headaches, focal weakness or numbness.  10 System ROS otherwise negative.  ____________________________________________   PHYSICAL EXAM:  VITAL SIGNS: ED Triage Vitals  Enc Vitals Group     BP 01/13/21 0835 (!) 128/94     Pulse Rate 01/13/21 0835 75     Resp 01/13/21  0835 17     Temp 01/13/21 0835 98.7 F (37.1 C)     Temp Source 01/13/21 0835 Oral     SpO2 01/13/21 0835 98 %     Weight --      Height --      Head Circumference --      Peak Flow --      Pain Score 01/13/21 0833 9     Pain Loc --      Pain Edu? --      Excl. in GC? --      Constitutional: Alert and oriented. Well appearing and in no acute distress. Eyes: Conjunctivae are normal. PERRL. EOMI. Head: Atraumatic. ENT:      Ears:       Nose: No congestion/rhinnorhea.      Mouth/Throat: Mucous membranes are moist.  Neck: No stridor.  No cervical spine tenderness to palpation.  Cardiovascular: Normal rate, regular rhythm. Normal S1 and S2.  No appreciable murmurs, rubs, gallops.  Good peripheral circulation. Respiratory: Normal respiratory effort without tachypnea or retractions. Lungs CTAB. Good air entry to the bases with no decreased or absent breath sounds. Gastrointestinal: Bowel sounds 4 quadrants. Soft and nontender to palpation. No guarding or rigidity. No palpable masses. No distention. No CVA tenderness. Musculoskeletal: Full range of motion to all extremities. No gross deformities appreciated.  Examination of back reveals no visible signs of trauma.  No abrasions, lacerations, ecchymosis.  Nontender to palpation along the spine or the ribs.  No palpable abnormality.  Good underlying breath sounds bilaterally.  No crepitus or subcutaneous emphysema. Neurologic:  Normal speech and language. No gross focal neurologic deficits are appreciated.  Skin:  Skin is warm, dry and intact. No rash noted. Psychiatric: Mood and affect are normal. Speech and behavior are normal. Patient exhibits appropriate insight and judgement.   ____________________________________________   LABS (all labs ordered are listed, but only abnormal results are displayed)  Labs Reviewed - No data to  display ____________________________________________  EKG   ____________________________________________  RADIOLOGY   No results found.  ____________________________________________    PROCEDURES  Procedure(s) performed:    Procedures    Medications - No data to display   ____________________________________________   INITIAL IMPRESSION / ASSESSMENT AND PLAN / ED COURSE  Pertinent labs & imaging results that were available during my care of the patient were reviewed by me and considered in my medical decision making (see chart for details).  Review of the [place] CSRS was performed in accordance of the [facility] prior to dispensing any controlled drugs.           Patient's diagnosis is consistent with backslash chest pain.  Patient presented to the emergency department with nonspecific backslash chest pain.  She states that she had a syncopal episode 3 days ago, has developed back pain radiating into her left chest.  Patient describes this as an aching and sharp sensation.  Slightly pleuritic in nature.  Patient with no associated shortness of breath.  No radiation to the neck, down the left arm.  No cardiac history.  Patient with no recent prolonged immobilization or surgeries.  No hormone use.  Patient had with no tenderness on exam.  She did have a syncopal episode with the symptoms.  Concern given the symptoms for differential that includes ACS/STEMI, PE, pneumonia, bronchitis, musculoskeletal pain.  I discussed the differential with the patient.  In the urgent care I have offered EKG but ultimately recommend going to the emergency department.  Patient was initially hesitant to proceed to the emergency department but after discussion she agrees to.  I have offered the EKG prior to going over to the ED, but patient states "if I am going to the ED I will just have everything done there."  Feel this is reasonable as we are directly across from the emergency department.  Patient will  travel directly to the emergency department on discharge for further evaluation.    ____________________________________________  FINAL CLINICAL IMPRESSION(S) / DIAGNOSES  Final diagnoses:  Acute left-sided thoracic back pain  Nonspecific chest pain      NEW MEDICATIONS STARTED DURING THIS VISIT:  ED Discharge Orders    None          This chart was dictated using voice recognition software/Dragon. Despite best efforts to proofread, errors can occur which can change the meaning. Any change was purely unintentional.    Racheal Patches, PA-C 01/13/21 [phone]

## 2021-01-13 NOTE — ED Notes (Signed)
DC instructions reviewed with pt.  PT verbalized instructions.  PT Dc.

## 2021-01-13 NOTE — ED Triage Notes (Signed)
Pt sent down from UC for back pain and shoulder pain since Friday ,

## 2021-01-13 NOTE — ED Triage Notes (Signed)
Pt presents with left shoulder pain in the back X 2 days that is unrelieved with OTC medication and patches.  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

## 2021-01-13 NOTE — ED Provider Notes (Signed)
[facility] EMERGENCY DEPARTMENT Provider Note   CSN: [id number] Arrival date & time: 01/13/21  7829     History No chief complaint on file.   Tracy Noble is a 55 y.o. female with no pertinent past medical history.  Patient presents with chief complaint of possible syncopal event and back pain.  Patient reports that on Friday while on she was sitting in her car when she "could not cool off," and "just felt really weird."  This sensation continued despite having the air conditioner on and windows down.  Patient began to feel dizzy and had generalized weakness.  Patient is unsure if she had a syncopal episode.  Patient reports that she was woken by student who found her with her head leaned back against the seat.  Patient was able to ambulate inside and sat work for approximately 30 minutes before leaving to go home.  Patient was at her symptoms resolved after approximately 1 hour.  Patient was able to drive home without any difficulty.  Patient denies any chest pain, palpitations, shortness of breath preceding this event.  Patient woke Saturday morning and took a deep breath and reports feeling pain to left thoracic back.  Since then pain has been constant.  Patient describes pain as a aching and stabbing sensation.  Patient denies any radiation of pain.  Patient rates pain 10/10 on the pain scale.  Pain is worse with movement.  Patient did have some relief of her symptoms when using pain patch and tramadol.  Patient denies any recent heavy lifting, falls, or traumatic injury.  Patient denies any fevers, chills, URI symptoms, cough, hemoptysis, chest pain, shortness of breath, palpitations, leg swelling or tenderness, abdominal pain, dizziness, weakness to extremities, difficulty urinating, gait abnormality, saddle anesthesia.  Patient denies any IV drug use.  HPI     Past Medical History:  Diagnosis Date  . Back pain   . Tobacco use     Patient Active Problem List    Diagnosis Date Noted  . False positive serology for HIV 06/22/2014  . Tobacco use     Past Surgical History:  Procedure Laterality Date  . TUBAL LIGATION       OB History   No obstetric history on file.     Family History  Problem Relation Age of Onset  . HIV Father        IVDA  . Breast cancer Maternal Aunt 40  . Healthy Mother     Social History   Tobacco Use  . Smoking status: Current Every Day Smoker    Packs/day: 0.50    Types: Cigarettes    Start date: 08/24/1984  . Smokeless tobacco: Never Used  Vaping Use  . Vaping Use: Never used  Substance Use Topics  . Alcohol use: No  . Drug use: No    Home Medications Prior to Admission medications   Medication Sig Start Date End Date Taking? Authorizing Provider  acetaminophen (TYLENOL) 500 MG tablet Take 500 mg by mouth every 6 (six) hours as needed.    [provider]  cephALEXin (KEFLEX) 500 MG capsule Take 1 capsule (500 mg total) by mouth 4 (four) times daily. 05/10/20   Becky Augusta, NP  ondansetron (ZOFRAN) 4 MG tablet Take 1-2 tablets (4-8 mg total) by mouth every 6 (six) hours. As needed nausea 03/12/20   Eustace Moore, MD  traMADol-acetaminophen (ULTRACET) 37.5-325 MG tablet Take 1-2 tablets by mouth every 6 (six) hours as needed. 03/12/20  Eustace Moore, MD  ipratropium (ATROVENT) 0.06 % nasal spray Place 2 sprays into both nostrils 4 (four) times daily. 3-4 times/ day 06/15/17 03/12/20  Domenick Gong, MD    Allergies    Penicillins, Sulfa antibiotics, Sulfasalazine, Azithromycin, Erythromycin base, and Penicillamine  Review of Systems   Review of Systems  Constitutional: Negative for chills and fever.  HENT: Negative for congestion, rhinorrhea and sore throat.   Eyes: Negative for visual disturbance.  Respiratory: Negative for shortness of breath.   Cardiovascular: Negative for chest pain.  Gastrointestinal: Negative for abdominal pain, nausea and vomiting.  Genitourinary:  Negative for difficulty urinating.  Musculoskeletal: Positive for back pain. Negative for neck pain and neck stiffness.  Skin: Negative for color change and rash.  Neurological: Positive for syncope. Negative for dizziness, tremors, facial asymmetry, speech difficulty, weakness, light-headedness and headaches.  Psychiatric/Behavioral: Negative for confusion.    Physical Exam Updated Vital Signs BP (!) 127/99 (BP Location: Right Arm)   Pulse 66   Temp 98.7 F (37.1 C)   Resp 18   LMP 12/19/2016 (Exact Date)   SpO2 98%   Physical Exam Vitals and nursing note reviewed.  Constitutional:      General: She is not in acute distress.    Appearance: She is not ill-appearing, toxic-appearing or diaphoretic.  HENT:     Head: Normocephalic and atraumatic. No raccoon eyes, abrasion, contusion, masses, right periorbital erythema, left periorbital erythema or laceration.     Jaw: No trismus or pain on movement.     Mouth/Throat:     Pharynx: Oropharynx is clear. Uvula midline. No pharyngeal swelling, oropharyngeal exudate, posterior oropharyngeal erythema or uvula swelling.  Eyes:     General: No scleral icterus.       Right eye: No discharge.        Left eye: No discharge.     Extraocular Movements: Extraocular movements intact.     Pupils: Pupils are equal, round, and reactive to light.  Cardiovascular:     Rate and Rhythm: Normal rate.     Heart sounds: Normal heart sounds.  Pulmonary:     Effort: Pulmonary effort is normal. No tachypnea, bradypnea or respiratory distress.     Breath sounds: Normal breath sounds.     Comments: Lungs clear to auscultation bilaterally Abdominal:     Palpations: Abdomen is soft.     Tenderness: There is no abdominal tenderness.  Musculoskeletal:     Cervical back: Normal range of motion and neck supple. No swelling, edema, deformity, erythema, signs of trauma, lacerations, rigidity, spasms, torticollis, tenderness, bony tenderness or crepitus. No pain  with movement. Normal range of motion.     Thoracic back: Tenderness present. No swelling, edema, deformity, signs of trauma, lacerations, spasms or bony tenderness.     Lumbar back: No swelling, edema, deformity, signs of trauma, lacerations, spasms, tenderness or bony tenderness.     Right lower leg: No swelling, tenderness or bony tenderness. No edema.     Left lower leg: No swelling, tenderness or bony tenderness. No edema.     Comments: No midline tenderness or deformity to cervical, thoracic, or lumbar spine  Patient has tenderness to left thoracic back, no swelling, deformity, laceration or edema noted  Skin:    General: Skin is warm and dry.  Neurological:     General: No focal deficit present.     Mental Status: She is alert and oriented to person, place, and time.     GCS: GCS eye  subscore is 4. GCS verbal subscore is 5. GCS motor subscore is 6.     Cranial Nerves: No cranial nerve deficit or facial asymmetry.     Sensory: Sensation is intact.     Motor: No weakness, tremor, seizure activity or pronator drift.     Coordination: Finger-Nose-Finger Test normal.     Gait: Gait is intact. Gait normal.     Comments: CN II-XII intact, equal grip strength, +5 strength to bilateral upper and lower extremities    Psychiatric:        Behavior: Behavior is cooperative.     ED Results / Procedures / Treatments   Labs (all labs ordered are listed, but only abnormal results are displayed) Labs Reviewed  BASIC METABOLIC PANEL - Abnormal; Notable for the following components:      Result Value   Creatinine, Ser 1.13 (*)    GFR, Estimated 58 (*)    All other components within normal limits  CBC  D-DIMER, QUANTITATIVE  TROPONIN I (HIGH SENSITIVITY)  TROPONIN I (HIGH SENSITIVITY)    EKG EKG Interpretation  Date/Time:  Monday Jan 13 2021 12:14:00 EDT Ventricular Rate:  47 PR Interval:  162 QRS Duration: 86 QT Interval:  449 QTC Calculation: 397 R Axis:   74 Text  Interpretation: Sinus bradycardia Sinus bradycardia, been present before Confirmed by Coralee Pesa [phone]) on 01/13/2021 1:18:09 PM   Radiology DG Chest 2 View  Result Date: 01/13/2021 CLINICAL DATA:  back pain /between shoulder blades and left shoulder pain since Friday,near syncope today EXAM: CHEST - 2 VIEW COMPARISON:  06/25/2020 and previous FINDINGS: Lungs are clear. Heart size and mediastinal contours are within normal limits. No effusion.  No pneumothorax. Visualized bones unremarkable. IMPRESSION: No acute cardiopulmonary disease. Electronically Signed   By: Corlis Leak M.D.   On: 01/13/2021 10:56    Procedures Procedures   Medications Ordered in ED Medications  oxyCODONE-acetaminophen (PERCOCET/ROXICET) 5-325 MG per tablet 1 tablet (has no administration in time range)  methocarbamol (ROBAXIN) tablet 500 mg (500 mg Oral Given 01/13/21 1222)    ED Course  I have reviewed the triage vital signs and the nursing notes.  Pertinent labs & imaging results that were available during my care of the patient were reviewed by me and considered in my medical decision making (see chart for details).    MDM Rules/Calculators/A&P                          Alert 55 year old female no acute distress, nontoxic-appearing.  Patient presents with possible syncopal episode as well as left thoracic back pain.  Patient's possible syncopal episode occurred on Friday.  Patient's thoracic back pain began on Saturday morning.  Patient has been constant since then.  No radiation of pain.  Pain is improved with heat and tramadol.  No recent falls or injuries.  Patient is hemodynamically stable.  Lungs clear to auscultation bilaterally.  No midline tenderness or deformity to cervical, thoracic, or lumbar spine.  Patient has no tenderness to thoracic back.    Low suspicion for aortic dissection at this time.  Due to patient's near syncopal event as well as constant back pain concern for possible pulmonary  embolism.  Will obtain D-dimer.  BMP, CBC, EKG, chest x-ray and troponin obtained while patient was in triage.    Chest x-ray shows no acute cardiopulmonary disease. BMP shows creatinine elevated at 1.13; prior creatinine obtained 6 months prior elevated at 1.10.  We will have patient follow-up with primary care provider for reevaluation. Troponin 3 and 3.  Low suspicion for ACS at this time. D-dimer within normal limits; low suspicion for PE at this time. EKG shows sinus bradycardia.  Patient has history of the same.  We will check orthostatic vital signs.    Per note patient refused orthostatic vital signs being performed by nurse tech.  I spoke with patient who continues to refuse orthostatic vital signs.  Patient sitting up at side of bed and pulse rate noted to be 74.    Suspect that patient's pain is musculoskeletal in nature.  We will give her a prescription for Robaxin as well as lidocaine patch.  Discussed results, findings, treatment and follow up. Patient advised of return precautions. Patient verbalized understanding and agreed with plan.   Final Clinical Impression(s) / ED Diagnoses Final diagnoses:  Acute left-sided thoracic back pain    Rx / DC Orders ED Discharge Orders    None       Haskel SchroederBadalamente, Niyana Chesbro R, PA-C 01/13/21 1434    Horton, Clabe SealKristie M, DO 01/13/21 1615

## 2021-01-13 NOTE — Discharge Instructions (Signed)
You came to the emergency department today to be evaluated for your back pain.  Your chest x-ray showed no signs of pneumonia.  Your lab work showed no signs of blood clot in your lungs.  Your lab work did show that your creatinine was slightly elevated.  This is a marker of your kidney function.  Please avoid using any NSAIDs such as ibuprofen until you can be seen by your primary care provider for repeat testing.   I have given you a prescription for Robaxin and lidocaine patches to be used for your thoracic back pain.  Please take these medications as prescribed.  Today you were prescribed Methocarbamol (Robaxin).  Methocarbamol (Robaxin) is used to treat muscle spasms/pain.  It works by helping to relax the muscles.  Drowsiness, dizziness, lightheadedness, stomach upset, nausea/vomiting, or blurred vision may occur.  Do not drive, use machinery, or do anything that needs alertness or clear vision until you can do it safely.  Do not combine this medication with alcoholic beverages, marijuana, or other central nervous system depressants.    Please take Tylenol (acetaminophen) to relieve your pain.  You make take tylenol, up to 1,000 mg (two extra strength pills) every 8 hours as needed.  Do not take more than 3,000 mg tylenol in a 24 hour period (not more than one dose every 8 hours.  Please check all medication labels as many medications such as pain and cold medications may contain tylenol.  Do not drink alcohol while taking these medications.   Get help right away if: You develop new bowel or bladder control problems. You have unusual weakness or numbness in your arms or legs. You develop nausea or vomiting. You develop abdominal pain. You feel faint.

## 2021-01-13 NOTE — ED Notes (Signed)
Pt refusing orthostatic vitals told this tech  To unhook the cords and tell the doctor to bring her patch and doctors note so that she can go home.

## 2021-01-16 ENCOUNTER — Ambulatory Visit (HOSPITAL_COMMUNITY)
Admission: EM | Admit: 2021-01-16 | Discharge: 2021-01-16 | Disposition: A | Discharge status: Home / Self Care | Payer: BLUE CROSS/BLUE SHIELD | Attending: Family Medicine | Admitting: Family Medicine

## 2021-01-16 ENCOUNTER — Other Ambulatory Visit: Payer: Self-pay

## 2021-01-16 ENCOUNTER — Encounter (HOSPITAL_COMMUNITY): Payer: Self-pay | Admitting: Emergency Medicine

## 2021-01-16 ENCOUNTER — Ambulatory Visit (INDEPENDENT_AMBULATORY_CARE_PROVIDER_SITE_OTHER): Payer: BLUE CROSS/BLUE SHIELD

## 2021-01-16 DIAGNOSIS — M549 Dorsalgia, unspecified: Secondary | ICD-10-CM

## 2021-01-16 DIAGNOSIS — M5134 Other intervertebral disc degeneration, thoracic region: Secondary | ICD-10-CM

## 2021-01-16 MED ORDER — KETOROLAC TROMETHAMINE 30 MG/ML IJ SOLN
30.0000 mg | Freq: Once | INTRAMUSCULAR | Status: AC
  Administered 2021-01-16: 30 mg via INTRAMUSCULAR

## 2021-01-16 MED ORDER — KETOROLAC TROMETHAMINE 30 MG/ML IJ SOLN
INTRAMUSCULAR | Status: AC
  Filled 2021-01-16: qty 1

## 2021-01-16 MED ORDER — PREDNISONE 20 MG PO TABS: 40.0000 mg | ORAL_TABLET | Freq: Every day | ORAL | 0 refills | Status: AC

## 2021-01-16 NOTE — ED Provider Notes (Signed)
[facility]    CSN: [id number] Arrival date & time: 01/16/21  1020      History   Chief Complaint Chief Complaint  Patient presents with  . Back Pain    HPI Tracy Noble is a 55 y.o. female.   HPI  Patient presents today for evaluation of ongoing midthoracic left-sided back pain.  She has been seen for the same problem on 01/13/21 at the emergency department, and here urgent care. ER treated pain with a prescription of lidocaine patches and methocarbamol.  Patient was concerned she may have had pneumonia however the ER did perform a chest x-ray which was negative.  The only thing patient recalls sneezing really hard approximately 5 days ago and noticed mild pain at that time but upon awakening the following day pain has gradually become more more intense.  Pain is present with standing, sitting or reaching all intensifies the level of pain.    Past Medical History:  Diagnosis Date  . Back pain   . Tobacco use     Patient Active Problem List   Diagnosis Date Noted  . False positive serology for HIV 06/22/2014  . Tobacco use     Past Surgical History:  Procedure Laterality Date  . TUBAL LIGATION      OB History   No obstetric history on file.      Home Medications    Prior to Admission medications   Medication Sig Start Date End Date Taking? Authorizing Provider  acetaminophen (TYLENOL) 500 MG tablet Take 500 mg by mouth every 6 (six) hours as needed.   Yes [provider]  lidocaine (LIDODERM) 5 % Place 1 patch onto the skin daily. Remove & Discard patch within 12 hours or as directed by MD 01/13/21  Yes Haskel Schroeder, PA-C  methocarbamol (ROBAXIN) 500 MG tablet Take 1 tablet (500 mg total) by mouth every 8 (eight) hours as needed for muscle spasms. 01/13/21  Yes Haskel Schroeder, PA-C  cephALEXin (KEFLEX) 500 MG capsule Take 1 capsule (500 mg total) by mouth 4 (four) times daily. Patient not taking: Reported on 01/16/2021 05/10/20    Becky Augusta, NP  ondansetron (ZOFRAN) 4 MG tablet Take 1-2 tablets (4-8 mg total) by mouth every 6 (six) hours. As needed nausea 03/12/20   Eustace Moore, MD  traMADol-acetaminophen (ULTRACET) 37.5-325 MG tablet Take 1-2 tablets by mouth every 6 (six) hours as needed. Patient not taking: Reported on 01/16/2021 03/12/20   Eustace Moore, MD  ipratropium (ATROVENT) 0.06 % nasal spray Place 2 sprays into both nostrils 4 (four) times daily. 3-4 times/ day 06/15/17 03/12/20  Domenick Gong, MD    Family History Family History  Problem Relation Age of Onset  . HIV Father        IVDA  . Breast cancer Maternal Aunt 40  . Healthy Mother     Social History Social History   Tobacco Use  . Smoking status: Current Every Day Smoker    Packs/day: 0.50    Types: Cigarettes    Start date: 08/24/1984  . Smokeless tobacco: Never Used  Vaping Use  . Vaping Use: Never used  Substance Use Topics  . Alcohol use: No  . Drug use: No     Allergies   Penicillins, Sulfa antibiotics, Sulfasalazine, Azithromycin, Erythromycin base, and Penicillamine   Review of Systems Review of Systems Pertinent negatives listed in HPI  Physical Exam Triage Vital Signs ED Triage Vitals  Enc Vitals Group  BP 01/16/21 1134 (!) 150/78     Pulse Rate 01/16/21 1134 62     Resp 01/16/21 1134 (!) 22     Temp 01/16/21 1134 98 F (36.7 C)     Temp Source 01/16/21 1134 Oral     SpO2 01/16/21 1134 98 %     Weight --      Height --      Head Circumference --      Peak Flow --      Pain Score 01/16/21 1131 10     Pain Loc --      Pain Edu? --      Excl. in GC? --    No data found.  Updated Vital Signs BP (!) 150/78 (BP Location: Left Arm) Comment (BP Location): large  Pulse 62   Temp 98 F (36.7 C) (Oral)   Resp (!) 22   LMP 12/19/2016 (Exact Date)   SpO2 98%   Visual Acuity Right Eye Distance:   Left Eye Distance:   Bilateral Distance:    Right Eye Near:   Left Eye Near:    Bilateral  Near:     Physical Exam Constitutional:      Appearance: She is not toxic-appearing.  Cardiovascular:     Rate and Rhythm: Normal rate.  Pulmonary:     Effort: Pulmonary effort is normal.  Musculoskeletal:       Arms:  Neurological:     Mental Status: She is alert.  Psychiatric:        Mood and Affect: Affect is tearful.     Comments: Tearful related to pain       UC Treatments / Results  Labs (all labs ordered are listed, but only abnormal results are displayed) Labs Reviewed - No data to display  EKG   Radiology No results found.  Procedures Procedures (including critical care time)  Medications Ordered in UC Medications - No data to display  Initial Impression / Assessment and Plan / UC Course  I have reviewed the triage vital signs and the nursing notes.  Pertinent labs & imaging results that were available during my care of the patient were reviewed by me and considered in my medical decision making (see chart for details).    Patient presents today with mid left thoracic pain, thoracic x-ray shows degenerative disc disease consistent with that of arthritic changes.  Treating with a burst of prednisone.  Patient given Toradol while here in clinic.  She has an appointment to see orthopedics next week.  Advised to keep appointment.  She was also advised to use good ergonomics when lifting objects and to wear comfortable shoes to prevent acute exacerbations of back pain.  Patient verbalized understanding and agreement with plan.   Final Clinical Impressions(s) / UC Diagnoses   Final diagnoses:  Mid back pain on left side  Degenerative disc disease, thoracic     Discharge Instructions     Keep follow-up with your orthopedic specialist.  Start prednisone 40 mg once daily for 5 days.  Take with food.  Avoid taking late in the evening as this can cause insomnia.  Continue methocarbamol for acute back pain.    ED Prescriptions    None     PDMP not  reviewed this encounter.   Bing Neighbors, FNP 01/16/21 1310

## 2021-01-16 NOTE — ED Triage Notes (Addendum)
Pain since Sunday.  Patient was seen on 01/13/2021.  Pain continues.  Sitting and standing is causes worsening of pain. Left  mid to upper back pain continues  No known injury

## 2021-01-16 NOTE — Discharge Instructions (Addendum)
Keep follow-up with your orthopedic specialist.  Start prednisone 40 mg once daily for 5 days.  Take with food.  Avoid taking late in the evening as this can cause insomnia.  Continue methocarbamol for acute back pain.

## 2021-01-24 IMAGING — DX DG THORACIC SPINE 2V
2 series · 2 of 2 positions shown · non-contrast
Comparison: Chest radiograph dated 08/09/2013.

CLINICAL DATA: 53-year-old female with pain between the shoulder
blade for several weeks.

EXAM:
THORACIC SPINE 2 VIEWS

[t-spine ap]
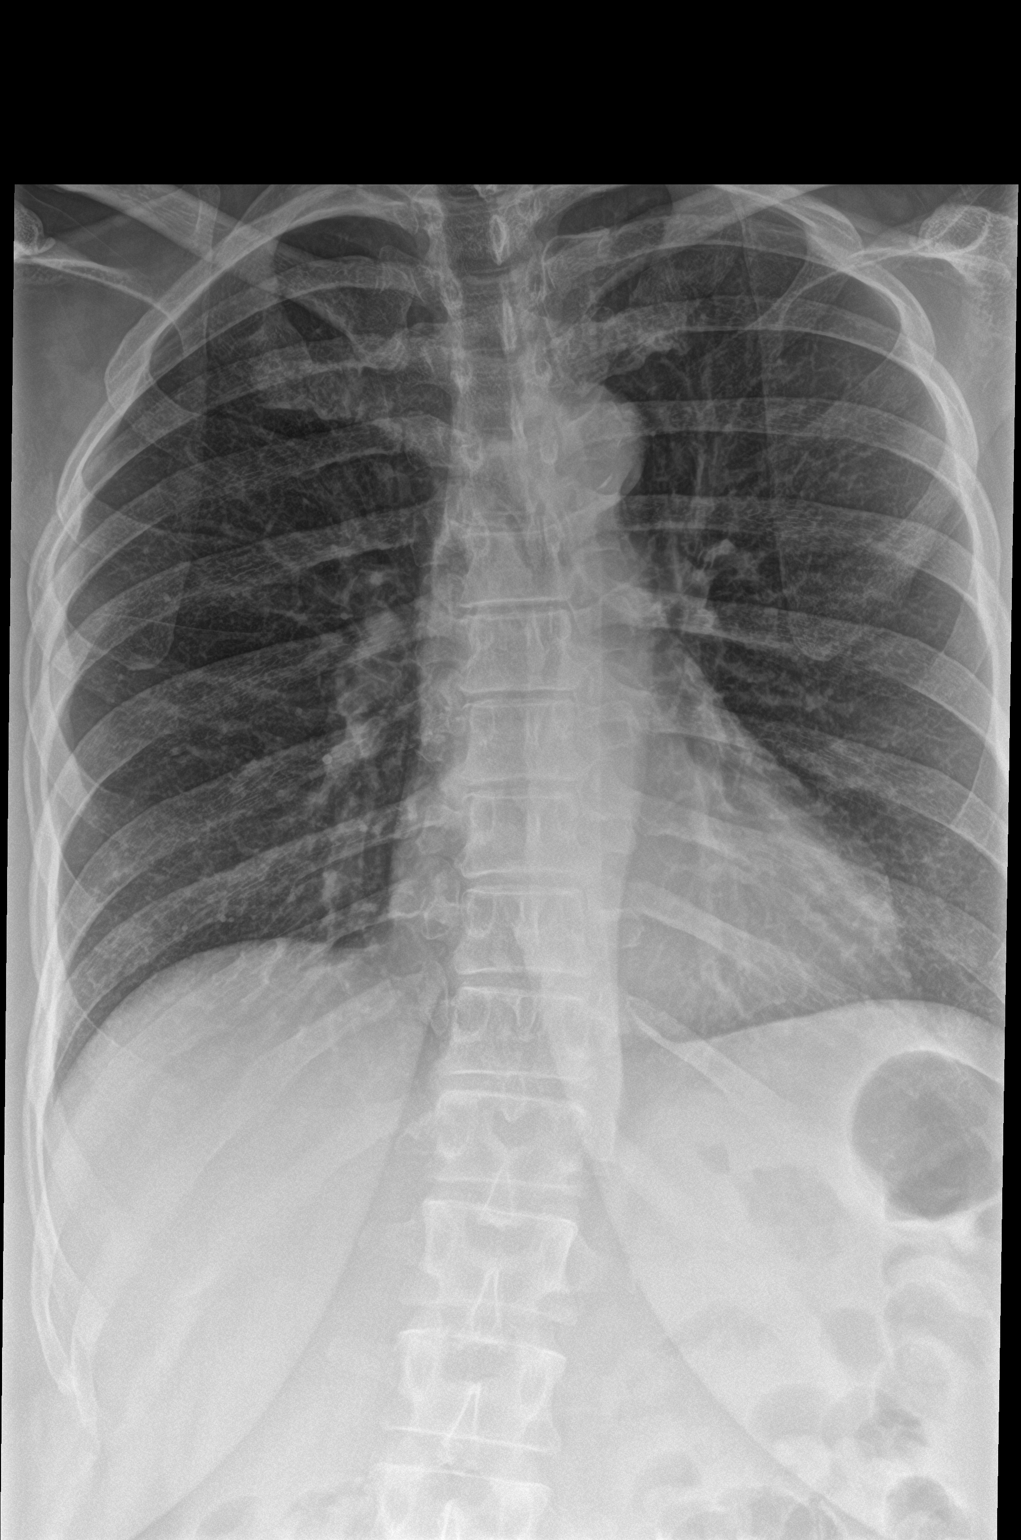

[t-spine lat]
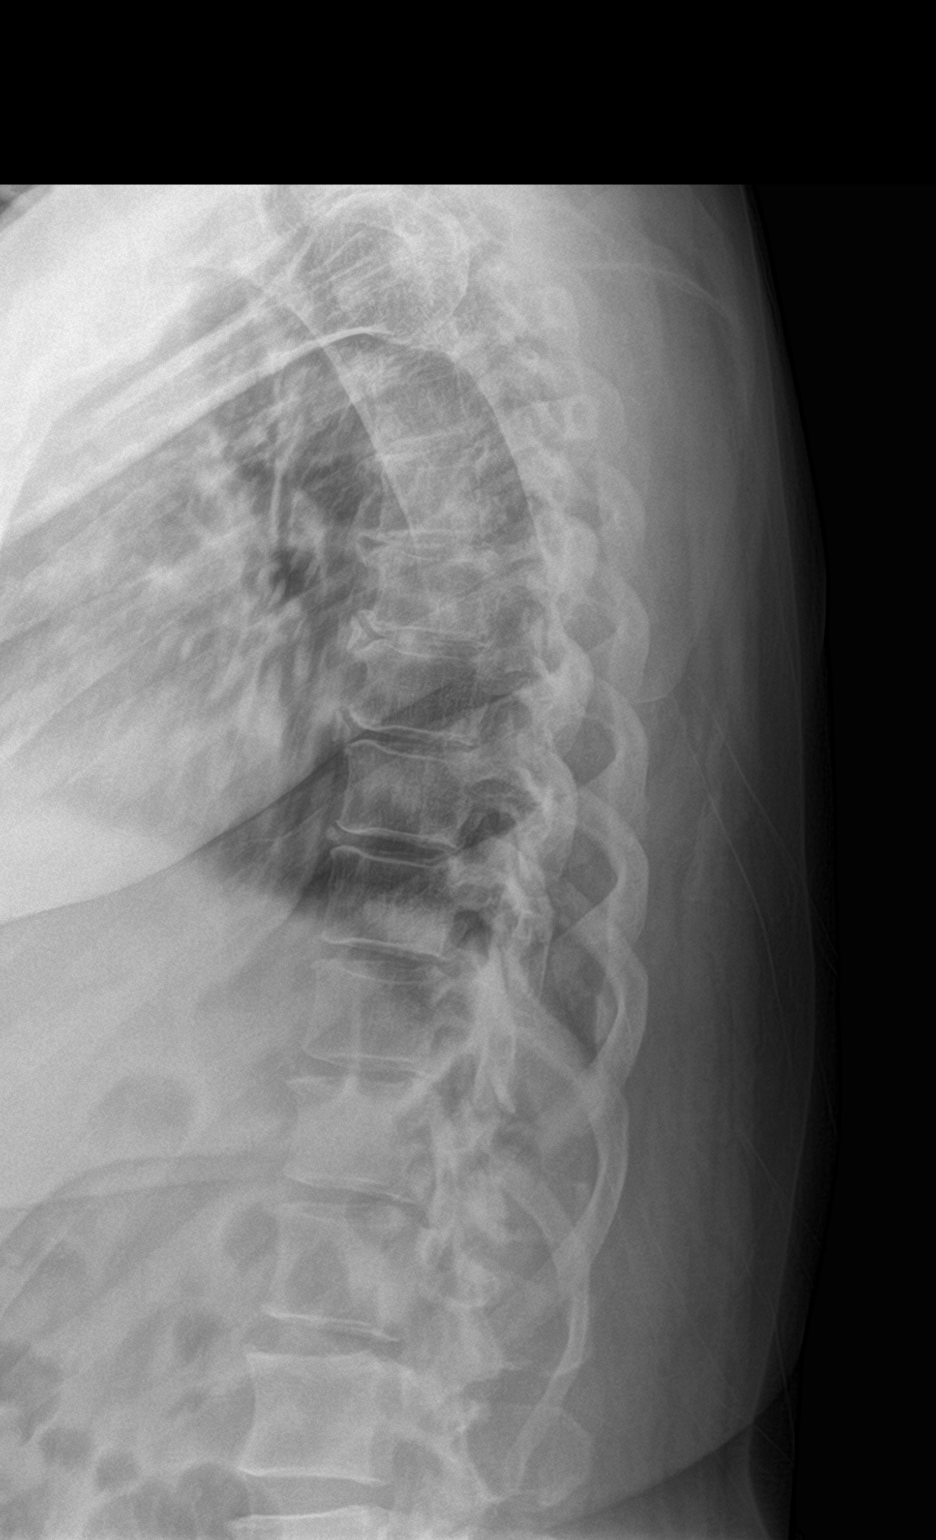

[2 of 2 positions shown; findings below may reference images not displayed]

FINDINGS: There is no acute fracture or subluxation. There is mild multilevel
degenerative changes, bone spurring. There is Levocurvature of the
lower thoracic spine which may be partly positional. Atherosclerotic
calcification of the aortic arch noted. The soft tissues are
unremarkable.
IMPRESSION: 1. No acute/traumatic thoracic spine pathology.
2. Degenerative changes with lower thoracic levocurvature.

## 2021-10-07 IMAGING — DX DG CHEST 2V
2 series · 2 of 2 positions shown · non-contrast
Comparison: Radiograph 1-1/2 hours prior

CLINICAL DATA: Chest pain.  Awoke with chest pressure.

EXAM:
CHEST - 2 VIEW

[w chest pa]
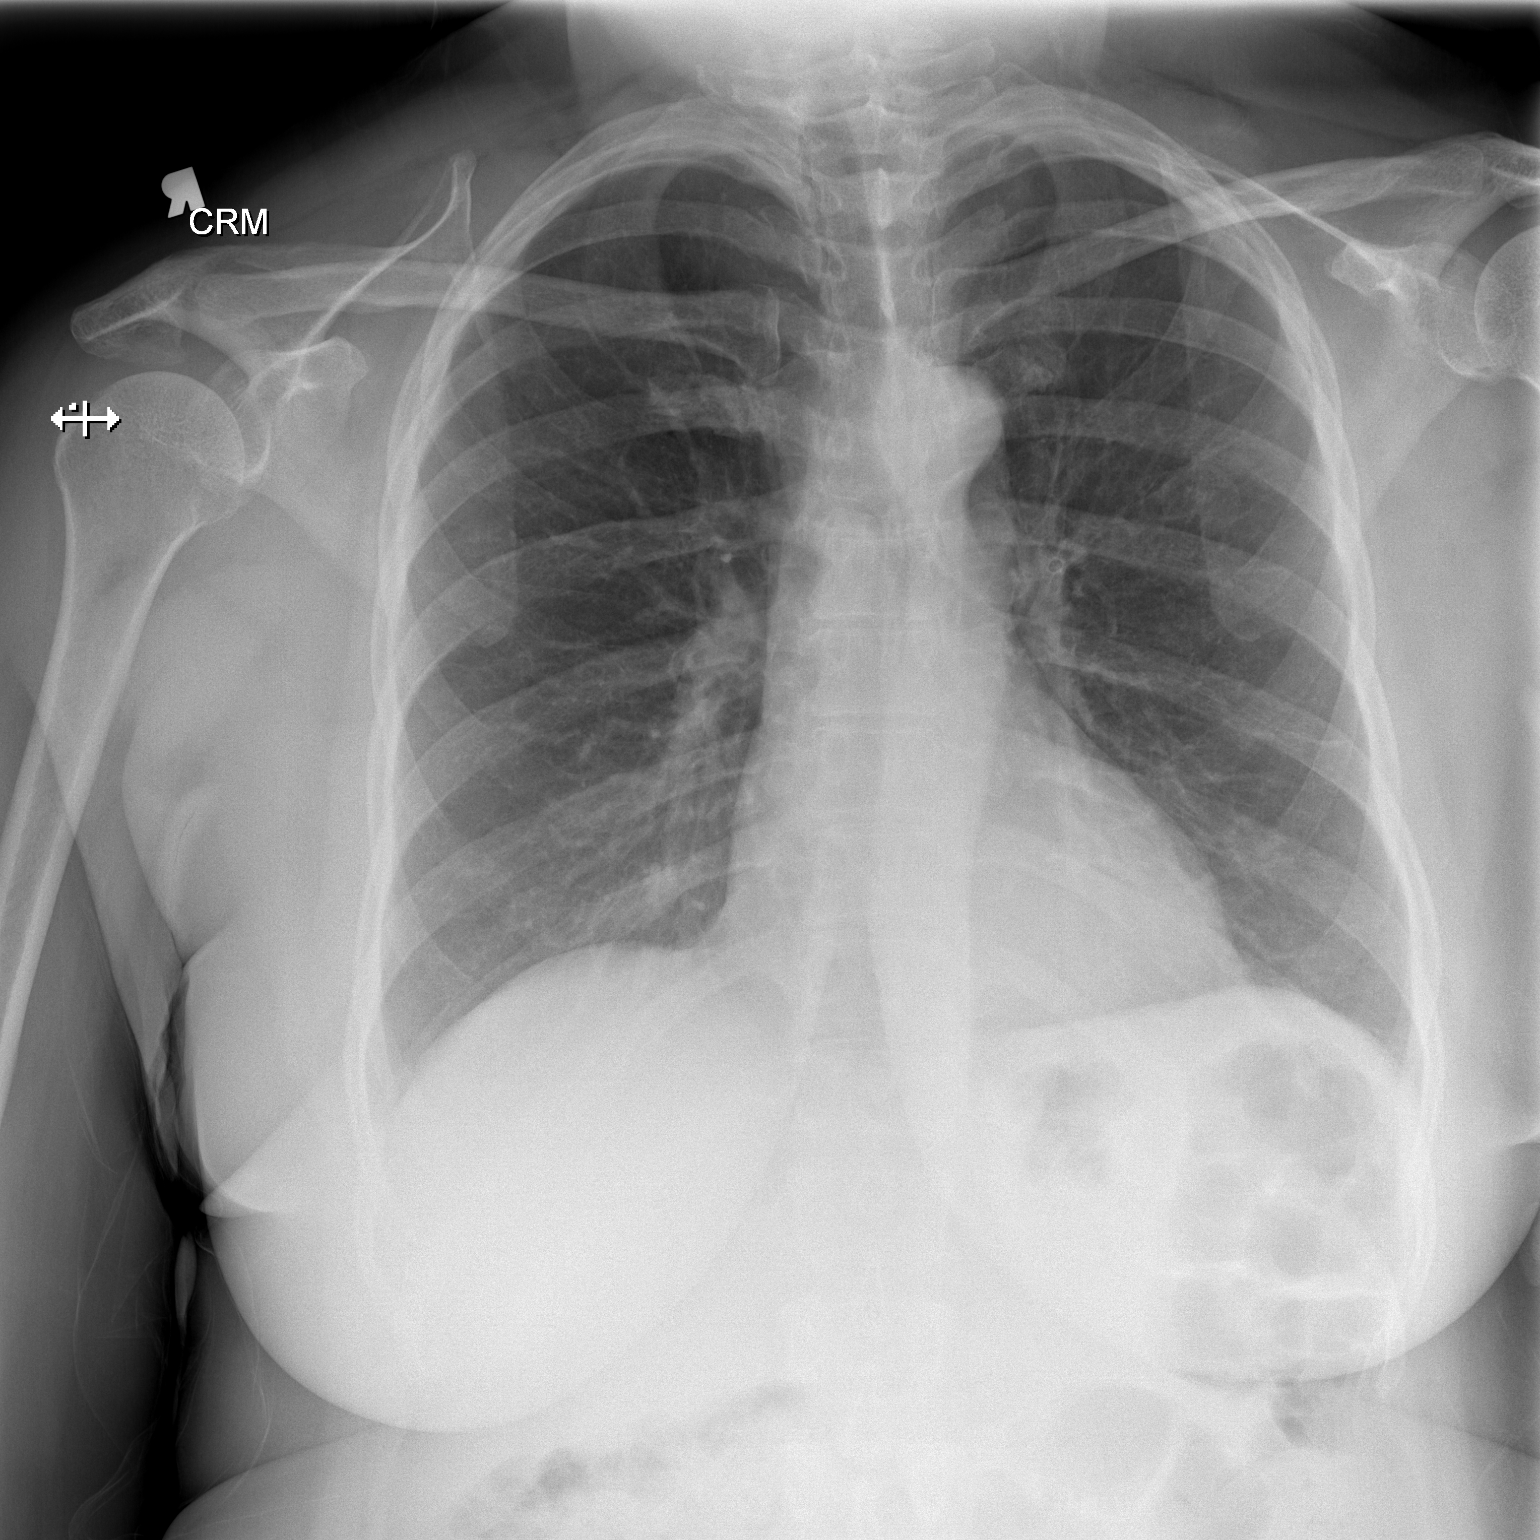

[w chest lat]
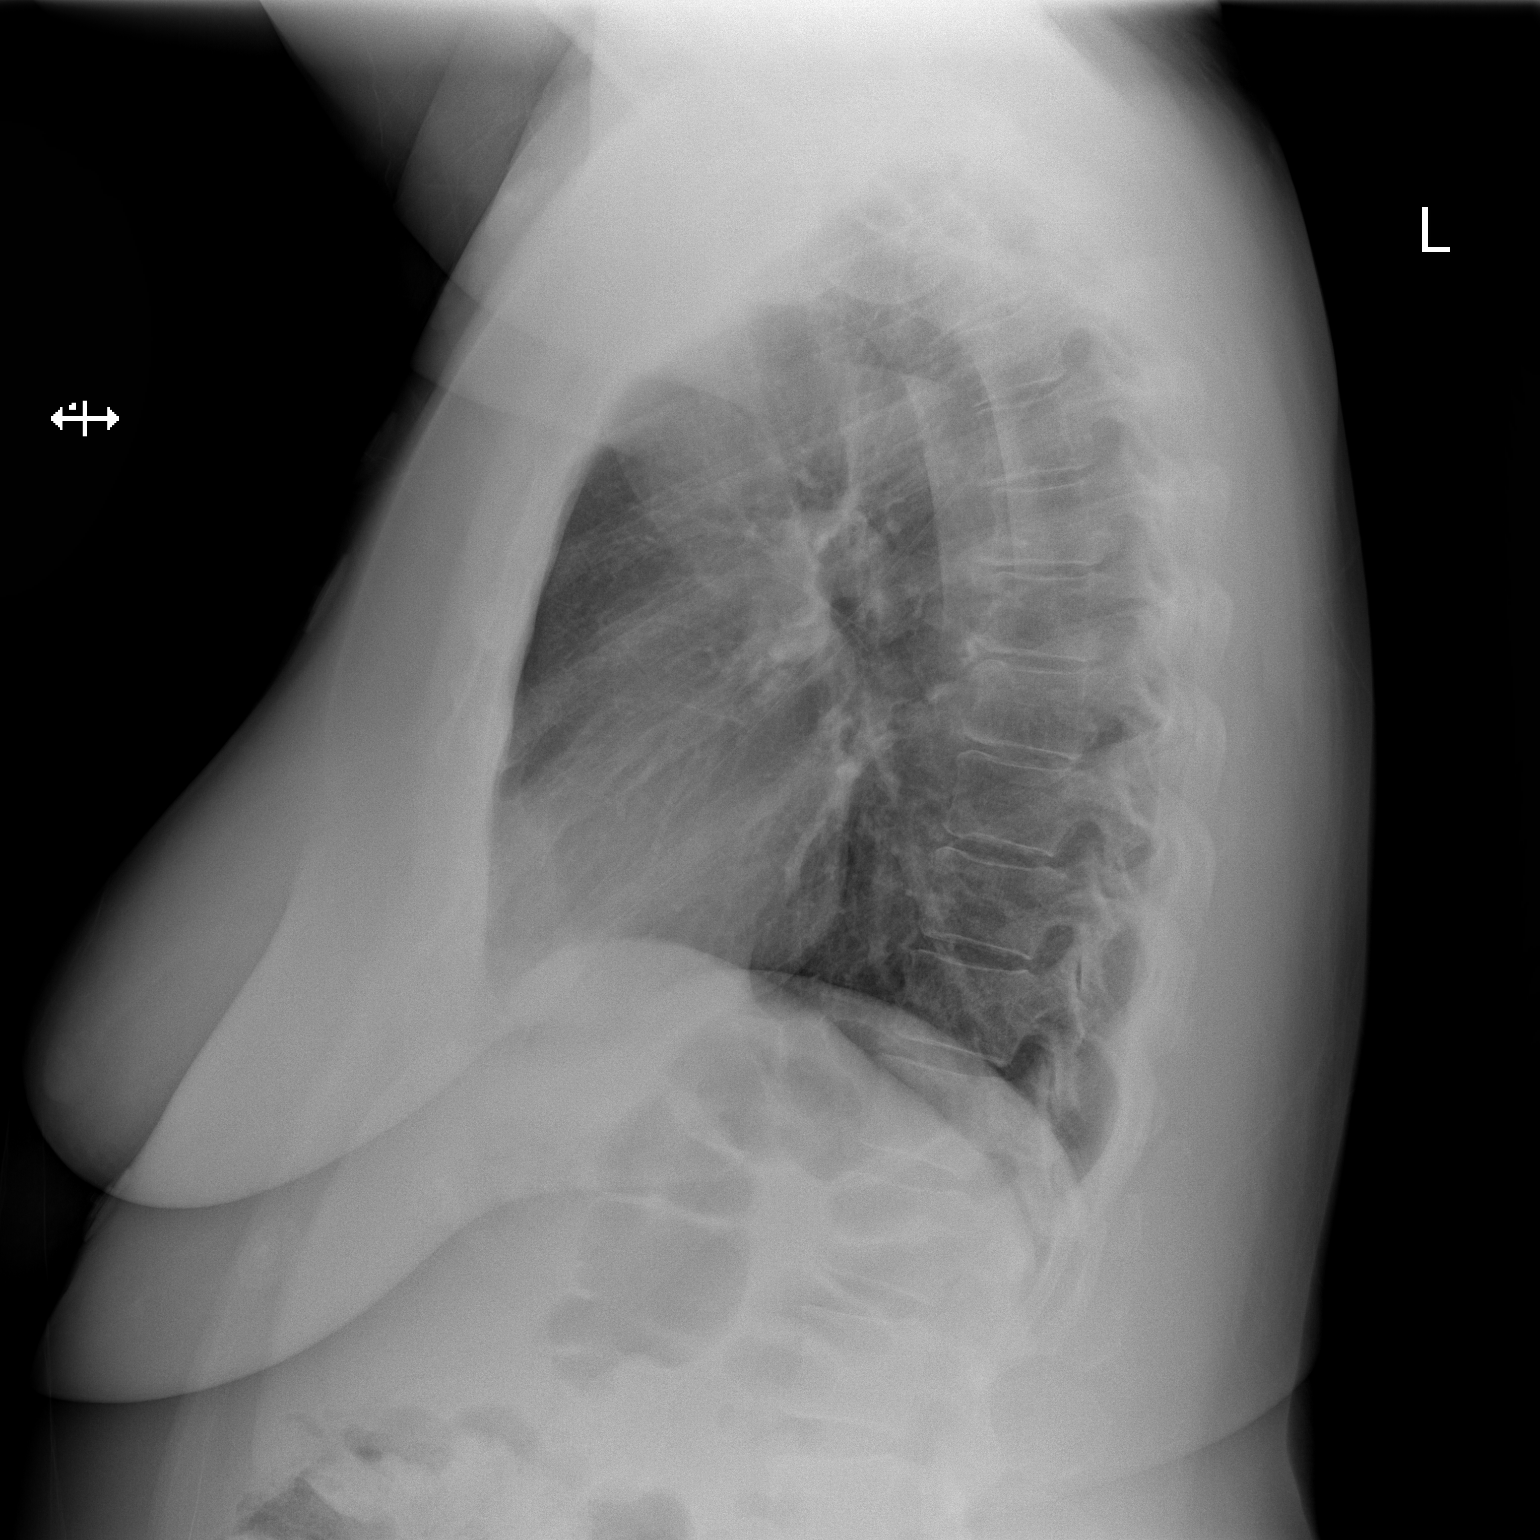

[2 of 2 positions shown; findings below may reference images not displayed]

FINDINGS: The cardiomediastinal contours are normal. The lungs are clear.
Pulmonary vasculature is normal. No consolidation, pleural effusion,
or pneumothorax. No acute osseous abnormalities are seen.
IMPRESSION: Negative radiographs of the chest.

## 2022-04-27 IMAGING — CR DG CHEST 2V
2 series · 2 of 2 positions shown · non-contrast
Comparison: 06/25/2020 and previous

CLINICAL DATA: back pain /between shoulder blades and left shoulder
pain since [REDACTED],near syncope today

EXAM:
CHEST - 2 VIEW

[chest pa]
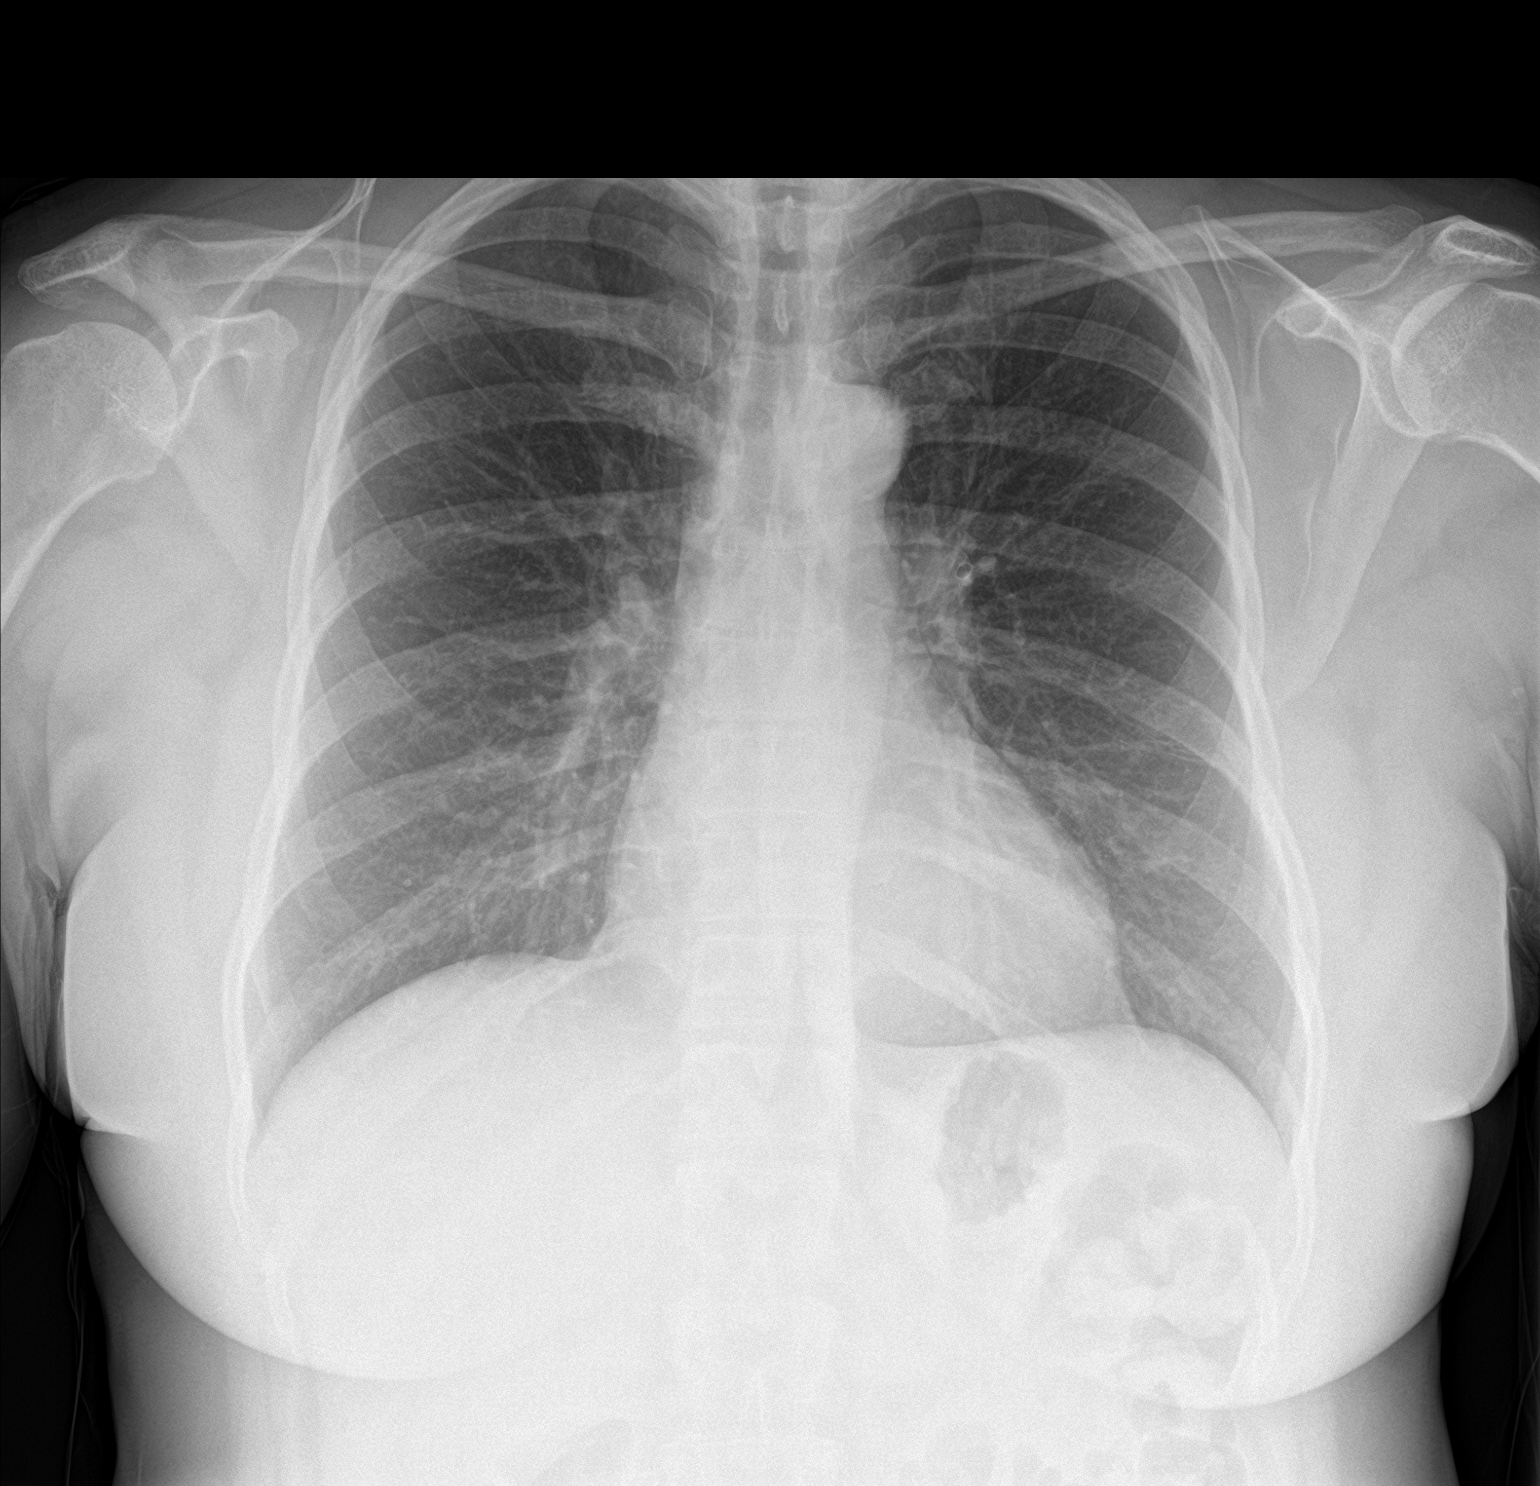

[chest lat]
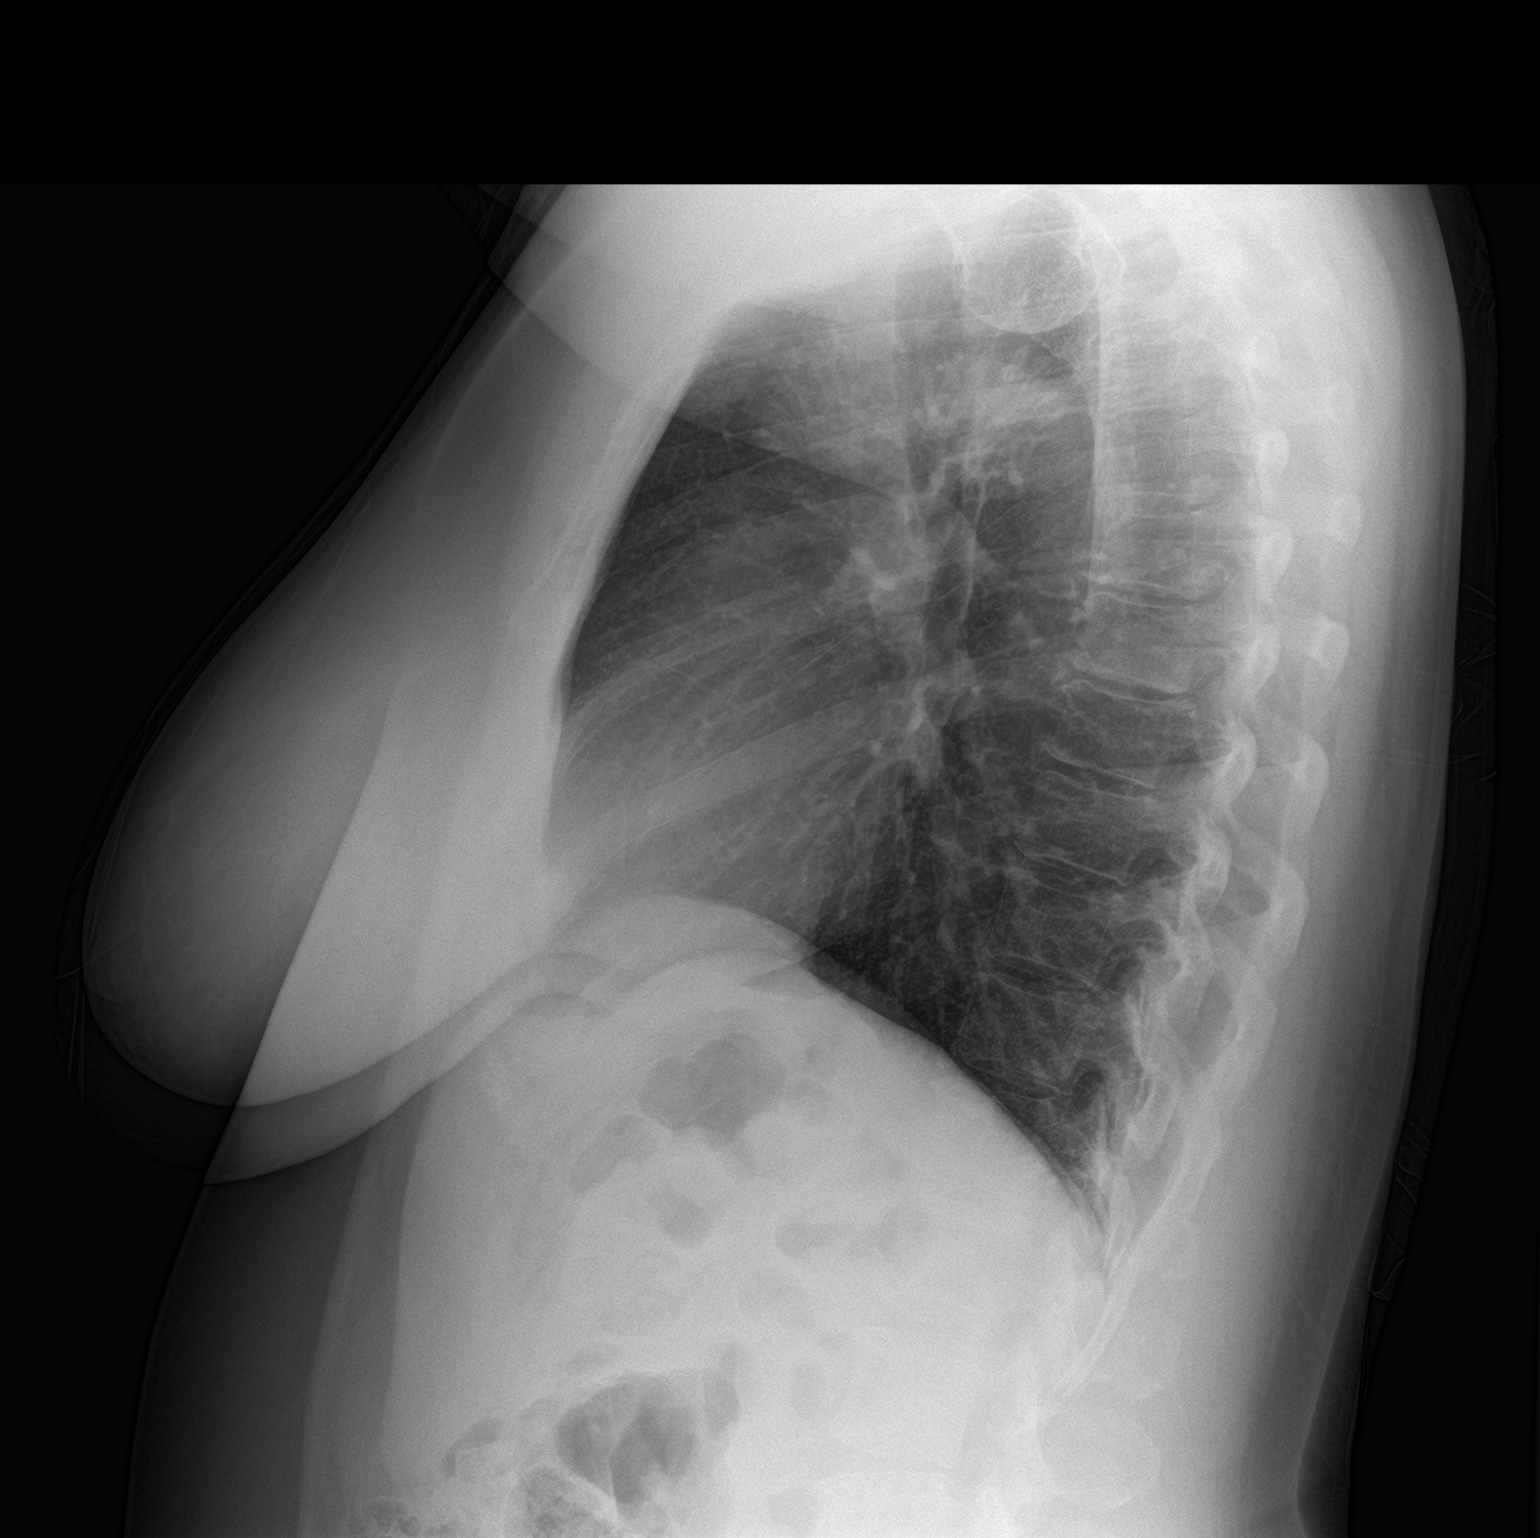

[2 of 2 positions shown; findings below may reference images not displayed]

FINDINGS: Lungs are clear.

Heart size and mediastinal contours are within normal limits.

No effusion.  No pneumothorax.

Visualized bones unremarkable.
IMPRESSION: No acute cardiopulmonary disease.
